# Patient Record
Sex: Female | Born: 1980
Health system: Southern US, Community
[De-identification: ages and names within clinical notes are randomized; demographics above are authoritative.]

## PROBLEM LIST (undated history)

## (undated) DIAGNOSIS — R87629 Unspecified abnormal cytological findings in specimens from vagina: Secondary | ICD-10-CM

## (undated) HISTORY — DX: Unspecified abnormal cytological findings in specimens from vagina: R87.629

---

## 2015-11-25 LAB — HM HIV SCREENING LAB: HM HIV Screening: NEGATIVE

## 2018-08-29 ENCOUNTER — Other Ambulatory Visit: Payer: Self-pay

## 2018-08-29 ENCOUNTER — Encounter: Payer: Self-pay | Admitting: Family Medicine

## 2018-08-29 ENCOUNTER — Ambulatory Visit (INDEPENDENT_AMBULATORY_CARE_PROVIDER_SITE_OTHER): Payer: Commercial Managed Care - PPO | Admitting: Family Medicine

## 2018-08-29 VITALS — BP 98/63 | HR 72 | Wt 105.8 lb

## 2018-08-29 DIAGNOSIS — Z3201 Encounter for pregnancy test, result positive: Secondary | ICD-10-CM

## 2018-08-29 DIAGNOSIS — N926 Irregular menstruation, unspecified: Secondary | ICD-10-CM

## 2018-08-29 DIAGNOSIS — Z01419 Encounter for gynecological examination (general) (routine) without abnormal findings: Secondary | ICD-10-CM

## 2018-08-29 LAB — POCT URINE PREGNANCY: Preg Test, Ur: POSITIVE — AB

## 2018-08-29 NOTE — Progress Notes (Signed)
   ESTABLISH Care/PREVENTATIVE CARE ENCOUNTER NOTE  Subjective:   Linda Hanson is a 38 y.o. [phone] female here for a routine annual gynecologic exam.  Current complaints: ammenorhea- positive pregnancy test.  Currently nursing 33 yo son. Reports having two uncomplicated pregnancies and went post dates with both with natural labors using Bradley Method.  Denies abnormal vaginal bleeding, discharge, pelvic pain, problems with intercourse or other gynecologic concerns.    Gynecologic History Patient's last menstrual period was 07/22/2018. Contraception: none Last Pap: NIL, HPV neg per patient report---thinks it was 2017 Last mammogram: NA  The following portions of the patient's history were reviewed and updated as appropriate: allergies, current medications, past family history, past medical history, past social history, past surgical history and problem list.  Review of Systems Pertinent items are noted in HPI.   Objective:  BP 98/63   Pulse 72   Wt 105 lb 12.8 oz (48 kg)   LMP 07/22/2018  CONSTITUTIONAL: Well-developed, well-nourished female in no acute distress.  HENT:  Normocephalic, atraumatic, External right and left ear normal. Oropharynx is clear and moist EYES:  No scleral icterus.  NECK: Normal range of motion, supple, no masses.  Normal thyroid.  SKIN: Skin is warm and dry. No rash noted. Not diaphoretic. No erythema. No pallor. NEUROLOGIC: Alert and oriented to person, place, and time. Normal reflexes, muscle tone coordination. No cranial nerve deficit noted. PSYCHIATRIC: Normal mood and affect. Normal behavior. Normal judgment and thought content. CARDIOVASCULAR: Normal heart rate noted, regular rhythm. 2+ distal pulses. RESPIRATORY: Effort and breath sounds normal, no problems with respiration noted. BREASTS: Symmetric in size. No masses, skin changes, nipple drainage, or lymphadenopathy. ABDOMEN: Soft,  no distention noted.  No tenderness, rebound or guarding.  PELVIC:  Normal appearing external genitalia; normal appearing vaginal mucosa and cervix.  No abnormal discharge noted.   Normal uterine size, no other palpable masses, no uterine or adnexal tenderness. MUSCULOSKELETAL: Normal range of motion.   INCORRECT RESULT ENTERED BY CMA Demetrice Phillip Heal--- Patient's Pregnancy test was positive  Results for orders placed or performed in visit on 08/29/18 (from the past 48 hour(s))  POCT urine pregnancy     Status: None   Collection Time: 08/29/18 10:44 AM  Result Value Ref Range   Preg Test, Ur Negative Negative     Assessment and Plan:  1) Annual gynecologic examination -- did not collect pap, Needs ROI for last pap.  Will follow up results of pap smear and manage accordingly. Routine preventative health maintenance measures emphasized.  2) Amenorrhea-- UPT was positive (re-run after CMA finalized result, reviewed with lab representative to confirm positive result and with patient). Will bring pt back in 4 wks for initial prenatal care.   Please refer to After Visit Summary for other counseling recommendations.   Return in about 4 weeks (around 09/26/2018) for initial prenatal.  Future Appointments  Date Time Provider [place]  10/03/2018  9:30 AM Caren Macadam, MD [facility]  10/15/2018 10:00 AM Lesleigh Noe, MD [facility]    Caren Macadam, MD, MPH, ABFM Attending Physician Center for [facility]

## 2018-08-29 NOTE — Progress Notes (Signed)
New Patient need to sign medical release

## 2018-09-04 ENCOUNTER — Encounter: Payer: Self-pay | Admitting: Radiology

## 2018-10-02 DIAGNOSIS — O0990 Supervision of high risk pregnancy, unspecified, unspecified trimester: Secondary | ICD-10-CM | POA: Insufficient documentation

## 2018-10-03 ENCOUNTER — Ambulatory Visit (INDEPENDENT_AMBULATORY_CARE_PROVIDER_SITE_OTHER): Payer: Commercial Managed Care - PPO | Admitting: Nurse Practitioner

## 2018-10-03 ENCOUNTER — Encounter: Payer: Self-pay | Admitting: Nurse Practitioner

## 2018-10-03 ENCOUNTER — Other Ambulatory Visit: Payer: Self-pay | Admitting: Nurse Practitioner

## 2018-10-03 DIAGNOSIS — Z124 Encounter for screening for malignant neoplasm of cervix: Secondary | ICD-10-CM

## 2018-10-03 DIAGNOSIS — O0991 Supervision of high risk pregnancy, unspecified, first trimester: Secondary | ICD-10-CM | POA: Diagnosis not present

## 2018-10-03 DIAGNOSIS — O0990 Supervision of high risk pregnancy, unspecified, unspecified trimester: Secondary | ICD-10-CM

## 2018-10-03 DIAGNOSIS — Z3A10 10 weeks gestation of pregnancy: Secondary | ICD-10-CM | POA: Diagnosis not present

## 2018-10-03 DIAGNOSIS — Z113 Encounter for screening for infections with a predominantly sexual mode of transmission: Secondary | ICD-10-CM

## 2018-10-03 DIAGNOSIS — Z1151 Encounter for screening for human papillomavirus (HPV): Secondary | ICD-10-CM

## 2018-10-03 DIAGNOSIS — O09529 Supervision of elderly multigravida, unspecified trimester: Secondary | ICD-10-CM

## 2018-10-03 DIAGNOSIS — N898 Other specified noninflammatory disorders of vagina: Secondary | ICD-10-CM | POA: Diagnosis not present

## 2018-10-03 DIAGNOSIS — O09521 Supervision of elderly multigravida, first trimester: Secondary | ICD-10-CM

## 2018-10-03 MED ORDER — BLOOD PRESSURE KIT: PACK | 0 refills | Status: DC

## 2018-10-03 MED ORDER — BLOOD PRESSURE MONITORING KIT: PACK | 0 refills | Status: DC

## 2018-10-03 MED ORDER — PRENATAL + DHA 27-1 & 250 MG PO THPK: 1.0000 | PACK | Freq: Every day | ORAL | 11 refills | Status: DC

## 2018-10-03 NOTE — Progress Notes (Signed)
DATING AND VIABILITY SONOGRAM   Linda Hanson is a 38 y.o. year old [phone] with LMP Patient's last menstrual period was 07/22/2018. which would correlate to  [redacted]w[redacted]d weeks gestation.  She has regular menstrual cycles.   She is here today for a confirmatory initial sonogram.    GESTATION: SINGLETON yes     FETAL ACTIVITY:          Heart rate     162          The fetus is active.  GESTATIONAL AGE AND  BIOMETRICS:  Gestational criteria: Estimated Date of Delivery: 04/28/19 by LMP now at [redacted]w[redacted]d  Previous Scans:0  GESTATIONAL SAC            mm         weeks  CROWN RUMP LENGTH         3.85 mm        10.5 weeks                                                   AVERAGE EGA(BY THIS SCAN):  10.5 weeks  WORKING EDD( LMP ):  04/28/2019     TECHNICIAN COMMENTS:  Patient informed that the ultrasound is considered a limited obstetric ultrasound and is not intended to be a complete ultrasound exam. Patient also informed that the ultrasound is not being completed with the intent of assessing for fetal or placental anomalies or any pelvic abnormalities. Explained that the purpose of today's ultrasound is to assess for fetal heart rate. Patient acknowledges the purpose of the exam and the limitations of the study.     A copy of this report including all images has been saved and backed up to a second source for retrieval if needed. All measures and details of the anatomical scan, placentation, fluid volume and pelvic anatomy are contained in that report.  Crosby Oyster 10/03/2018 9:54 AM

## 2018-10-03 NOTE — Progress Notes (Signed)
Pap smear today Flu injection? No  Blood Pressure monitor sent to [facility]

## 2018-10-03 NOTE — Addendum Note (Signed)
Addended by: Phillip Heal, Constantinos Krempasky A on: 10/03/2018 04:50 PM   Modules accepted: Orders

## 2018-10-03 NOTE — Progress Notes (Signed)
Subjective:   Linda Hanson is a 38 y.o. [id number] at [redacted]w[redacted]d by LMP being seen today for her first obstetrical visit.  Her obstetrical history is significant for advanced maternal age. Patient does intend to breast feed. Pregnancy history fully reviewed.  Patient reports no complaints.  HISTORY: OB History  Gravida Para Term Preterm AB Living  6 2 2  0 3 2  SAB TAB Ectopic Multiple Live Births  3 0 0 0 2    # Outcome Date GA Lbr Len/2nd Weight Sex Delivery Anes PTL Lv  6 Current           5 Term 07/15/16 [redacted]w[redacted]d   M Vag-Spont None N LIV  4 SAB 07/11/15          3 SAB 05/11/15          2 Term 06/28/13 [redacted]w[redacted]d   F Vag-Spont None N LIV  1 SAB 01/10/09       N    Past Medical History:  Diagnosis Date  . Vaginal Pap smear, abnormal    hpv   History reviewed. No pertinent surgical history. History reviewed. No pertinent family history. Social History   Tobacco Use  . Smoking status: Never Smoker  . Smokeless tobacco: Never Used  Substance Use Topics  . Alcohol use: Not Currently    Alcohol/week: 1.0 standard drinks    Types: 1 Cans of beer per week  . Drug use: Never   Allergies  Allergen Reactions  . Penicillin G Hives   Current Outpatient Medications on File Prior to Visit  Medication Sig Dispense Refill  . Prenatal 28-0.8 MG TABS Take by mouth.     No current facility-administered medications on file prior to visit.      Exam   Vitals:   10/03/18 0931 10/03/18 0935  BP: 108/69   Pulse: 72   Weight: 108 lb 9.6 oz (49.3 kg)   Height:  5\' 1"  (1.549 m)   Fetal Heart Rate (bpm): 162  Uterus:  Fundal Height: 10 cm  Pelvic Exam: Perineum: no hemorrhoids, normal perineum   Vulva: normal external genitalia, no lesions   Vagina:  normal mucosa, normal discharge   Cervix: no lesions and normal, pap smear done. Closed and thick    Adnexa: normal adnexa and no mass, fullness, tenderness   Bony Pelvis: average  System: General: well-developed, well-nourished female  in no acute distress   Breast:  normal appearance, no masses or tenderness   Skin: normal coloration and turgor, no rashes   Neurologic: oriented, normal, negative, normal mood   Extremities: normal strength, tone, and muscle mass, ROM of all joints is normal   HEENT extraocular movement intact and sclera clear, anicteric   Mouth/Teeth mucous membranes moist, pharynx normal without lesions and dental hygiene good   Neck supple and no masses, normal thyroid   Cardiovascular: regular rate and rhythm   Respiratory:  no respiratory distress, normal breath sounds   Abdomen: soft, non-tender; no masses,  no organomegaly     Assessment:   Pregnancy: [id number] Patient Active Problem List   Diagnosis Date Noted  . Advanced maternal age in multigravida 10/03/2018  . Supervision of high risk pregnancy, antepartum 10/02/2018     Plan:  1. Supervision of high risk pregnancy, antepartum Mild nausea but no vomiting. Advised about [facility] and entrance C Client has had 2 Leory Plowman births and wants a natural birth this time as well. Currently still breastfeeds her 37 year old some  and plans to breastfeed this baby.  - Cytology - PAP( [place]) - Culture, OB Urine - Hemoglobinopathy evaluation - Enroll patient in Babyscripts Program - Obstetric Panel, Including HIV - [place] MFM OB COMP + 14 WK; Future  2. Antepartum multigravida of advanced maternal age Declines genetic testing today.   Initial labs drawn. Continue prenatal vitamins. Genetic Screening discussed, discussed and declines: .Marland Kitchen Ultrasound discussed; fetal anatomic survey: ordered. Problem list reviewed and updated. The nature of [facility] with multiple MDs and other Advanced Practice Providers was explained to patient; also emphasized that residents, students are part of our team. Routine obstetric precautions reviewed. Return in about 4 weeks (around 10/31/2018) for virtual ROB.  Total  face-to-face time with patient: 40 minutes.  Over 50% of encounter was spent on counseling and coordination of care.     Nolene Bernheim, FNP Family Nurse Practitioner, [facility] for [employer], [facility] 10/03/2018 12:04 PM

## 2018-10-03 NOTE — Addendum Note (Signed)
Addended by: Phillip Heal, DEMETRICE A on: 10/03/2018 04:48 PM   Modules accepted: Orders

## 2018-10-03 NOTE — Patient Instructions (Signed)
First Trimester of Pregnancy  The first trimester of pregnancy is from week 1 until the end of week 13 (months 1 through 3). During this time, your baby will begin to develop inside you. At 6-8 weeks, the eyes and face are formed, and the heartbeat can be seen on ultrasound. At the end of 12 weeks, all the baby's organs are formed. Prenatal care is all the medical care you receive before the birth of your baby. Make sure you get good prenatal care and follow all of your doctor's instructions. Follow these instructions at home: Medicines  Take over-the-counter and prescription medicines only as told by your doctor. Some medicines are safe and some medicines are not safe during pregnancy.  Take a prenatal vitamin that contains at least 600 micrograms (mcg) of folic acid.  If you have trouble pooping (constipation), take medicine that will make your stool soft (stool softener) if your doctor approves. Eating and drinking   Eat regular, healthy meals.  Your doctor will tell you the amount of weight gain that is right for you.  Avoid raw meat and uncooked cheese.  If you feel sick to your stomach (nauseous) or throw up (vomit): ? Eat 4 or 5 small meals a day instead of 3 large meals. ? Try eating a few soda crackers. ? Drink liquids between meals instead of during meals.  To prevent constipation: ? Eat foods that are high in fiber, like fresh fruits and vegetables, whole grains, and beans. ? Drink enough fluids to keep your pee (urine) clear or pale yellow. Activity  Exercise only as told by your doctor. Stop exercising if you have cramps or pain in your lower belly (abdomen) or low back.  Do not exercise if it is too hot, too humid, or if you are in a place of great height (high altitude).  Try to avoid standing for long periods of time. Move your legs often if you must stand in one place for a long time.  Avoid heavy lifting.  Wear low-heeled shoes. Sit and stand up straight.   You can have sex unless your doctor tells you not to. Relieving pain and discomfort  Wear a good support bra if your breasts are sore.  Take warm water baths (sitz baths) to soothe pain or discomfort caused by hemorrhoids. Use hemorrhoid cream if your doctor says it is okay.  Rest with your legs raised if you have leg cramps or low back pain.  If you have puffy, bulging veins (varicose veins) in your legs: ? Wear support hose or compression stockings as told by your doctor. ? Raise (elevate) your feet for 15 minutes, 3-4 times a day. ? Limit salt in your food. Prenatal care  Schedule your prenatal visits by the twelfth week of pregnancy.  Write down your questions. Take them to your prenatal visits.  Keep all your prenatal visits as told by your doctor. This is important. Safety  Wear your seat belt at all times when driving.  Make a list of emergency phone numbers. The list should include numbers for family, friends, the hospital, and police and fire departments. General instructions  Ask your doctor for a referral to a local prenatal class. Begin classes no later than at the start of month 6 of your pregnancy.  Ask for help if you need counseling or if you need help with nutrition. Your doctor can give you advice or tell you where to go for help.  Do not use hot tubs, steam   rooms, or saunas.  Do not douche or use tampons or scented sanitary pads.  Do not cross your legs for long periods of time.  Avoid all herbs and alcohol. Avoid drugs that are not approved by your doctor.  Do not use any tobacco products, including cigarettes, chewing tobacco, and electronic cigarettes. If you need help quitting, ask your doctor. You may get counseling or other support to help you quit.  Avoid cat litter boxes and soil used by cats. These carry germs that can cause birth defects in the baby and can cause a loss of your baby (miscarriage) or stillbirth.  Visit your dentist. At home,  brush your teeth with a soft toothbrush. Be gentle when you floss. Contact a doctor if:  You are dizzy.  You have mild cramps or pressure in your lower belly.  You have a nagging pain in your belly area.  You continue to feel sick to your stomach, you throw up, or you have watery poop (diarrhea).  You have a bad smelling fluid coming from your vagina.  You have pain when you pee (urinate).  You have increased puffiness (swelling) in your face, hands, legs, or ankles. Get help right away if:  You have a fever.  You are leaking fluid from your vagina.  You have spotting or bleeding from your vagina.  You have very bad belly cramping or pain.  You gain or lose weight rapidly.  You throw up blood. It may look like coffee grounds.  You are around people who have [place] measles, fifth disease, or chickenpox.  You have a very bad headache.  You have shortness of breath.  You have any kind of trauma, such as from a fall or a car accident. Summary  The first trimester of pregnancy is from week 1 until the end of week 13 (months 1 through 3).  To take care of yourself and your unborn baby, you will need to eat healthy meals, take medicines only if your doctor tells you to do so, and do activities that are safe for you and your baby.  Keep all follow-up visits as told by your doctor. This is important as your doctor will have to ensure that your baby is healthy and growing well. This information is not intended to replace advice given to you by your health care provider. Make sure you discuss any questions you have with your health care provider. Document Released: 06/15/2007 Document Revised: 04/19/2018 Document Reviewed: 01/05/2016 Elsevier Patient Education  [place].  Morning Sickness  Morning sickness is when you feel sick to your stomach (nauseous) during pregnancy. You may feel sick to your stomach and throw up (vomit). You may feel sick in the morning, but you  can feel this way at any time of day. Some women feel very sick to their stomach and cannot stop throwing up (hyperemesis gravidarum). Follow these instructions at home: Medicines  Take over-the-counter and prescription medicines only as told by your doctor. Do not take any medicines until you talk with your doctor about them first.  Taking multivitamins before getting pregnant can stop or lessen the harshness of morning sickness. Eating and drinking  Eat dry toast or crackers before getting out of bed.  Eat 5 or 6 small meals a day.  Eat dry and bland foods like rice and baked potatoes.  Do not eat greasy, fatty, or spicy foods.  Have someone cook for you if the smell of food causes you to feel sick  or throw up.  If you feel sick to your stomach after taking prenatal vitamins, take them at night or with a snack.  Eat protein when you need a snack. Nuts, yogurt, and cheese are good choices.  Drink fluids throughout the day.  Try ginger ale made with real ginger, ginger tea made from fresh grated ginger, or ginger candies. General instructions  Do not use any products that have nicotine or tobacco in them, such as cigarettes and e-cigarettes. If you need help quitting, ask your doctor.  Use an air purifier to keep the air in your house free of smells.  Get lots of fresh air.  Try to avoid smells that make you feel sick.  Try: ? Wearing a bracelet that is used for seasickness (acupressure wristband). ? Going to a doctor who puts thin needles into certain body points (acupuncture) to improve how you feel. Contact a doctor if:  You need medicine to feel better.  You feel dizzy or light-headed.  You are losing weight. Get help right away if:  You feel very sick to your stomach and cannot stop throwing up.  You pass out (faint).  You have very bad pain in your belly. Summary  Morning sickness is when you feel sick to your stomach (nauseous) during pregnancy.  You  may feel sick in the morning, but you can feel this way at any time of day.  Making some changes to what you eat may help your symptoms go away. This information is not intended to replace advice given to you by your health care provider. Make sure you discuss any questions you have with your health care provider. Document Released: 02/04/2004 Document Revised: 12/09/2016 Document Reviewed: 01/28/2016 Elsevier Patient Education  2020 [employer].

## 2018-10-04 LAB — CERVICOVAGINAL ANCILLARY ONLY
Bacterial Vaginitis (gardnerella): NEGATIVE
Candida Glabrata: NEGATIVE
Candida Vaginitis: NEGATIVE
Molecular Disclaimer: NEGATIVE
Molecular Disclaimer: NEGATIVE
Molecular Disclaimer: NEGATIVE
Molecular Disclaimer: NORMAL
Trichomonas: NEGATIVE

## 2018-10-05 LAB — OBSTETRIC PANEL, INCLUDING HIV
Antibody Screen: NEGATIVE
Basophils Absolute: 0.1 10*3/uL (ref 0.0–0.2)
Basos: 1 %
EOS (ABSOLUTE): 0 10*3/uL (ref 0.0–0.4)
Eos: 1 %
HIV Screen 4th Generation wRfx: NONREACTIVE
Hematocrit: 39.3 % (ref 34.0–46.6)
Hemoglobin: 13.2 g/dL (ref 11.1–15.9)
Hepatitis B Surface Ag: NEGATIVE
Immature Grans (Abs): 0 10*3/uL (ref 0.0–0.1)
Immature Granulocytes: 0 %
Lymphocytes Absolute: 1.7 10*3/uL (ref 0.7–3.1)
Lymphs: 20 %
MCH: 31 pg (ref 26.6–33.0)
MCHC: 33.6 g/dL (ref 31.5–35.7)
MCV: 92 fL (ref 79–97)
Monocytes Absolute: 0.5 10*3/uL (ref 0.1–0.9)
Monocytes: 6 %
Neutrophils Absolute: 6.2 10*3/uL (ref 1.4–7.0)
Neutrophils: 72 %
Platelets: 236 10*3/uL (ref 150–450)
RBC: 4.26 x10E6/uL (ref 3.77–5.28)
RDW: 11.8 % (ref 11.7–15.4)
RPR Ser Ql: NONREACTIVE
Rh Factor: NEGATIVE
Rubella Antibodies, IGG: 2.21 index (ref >0.99)
WBC: 8.6 10*3/uL (ref 3.4–10.8)

## 2018-10-05 LAB — CERVICOVAGINAL ANCILLARY ONLY
Chlamydia: NEGATIVE
Neisseria Gonorrhea: NEGATIVE

## 2018-10-05 LAB — HEMOGLOBINOPATHY EVALUATION
HGB C: 0 %
HGB S: 0 %
HGB VARIANT: 0 %
Hemoglobin A2 Quantitation: 2.3 % (ref 1.8–3.2)
Hemoglobin F Quantitation: 0 % (ref 0.0–2.0)
Hgb A: 97.7 % (ref 96.4–98.8)

## 2018-10-05 LAB — CULTURE, OB URINE

## 2018-10-05 LAB — URINE CULTURE, OB REFLEX

## 2018-10-09 ENCOUNTER — Encounter: Payer: Self-pay | Admitting: Nurse Practitioner

## 2018-10-09 DIAGNOSIS — R87810 Cervical high risk human papillomavirus (HPV) DNA test positive: Secondary | ICD-10-CM | POA: Insufficient documentation

## 2018-10-09 LAB — CYTOLOGY - PAP
Diagnosis: NEGATIVE
HPV 16: NEGATIVE
HPV 18 / 45: NEGATIVE
High risk HPV: POSITIVE — AB
Molecular Disclaimer: 56
Molecular Disclaimer: NEGATIVE
Molecular Disclaimer: NORMAL

## 2018-10-15 ENCOUNTER — Ambulatory Visit: Payer: Commercial Managed Care - PPO | Admitting: Family Medicine

## 2018-10-31 ENCOUNTER — Other Ambulatory Visit: Payer: Self-pay

## 2018-10-31 ENCOUNTER — Telehealth (INDEPENDENT_AMBULATORY_CARE_PROVIDER_SITE_OTHER): Payer: Commercial Managed Care - PPO | Admitting: Family Medicine

## 2018-10-31 ENCOUNTER — Encounter: Payer: Self-pay | Admitting: Family Medicine

## 2018-10-31 DIAGNOSIS — O0992 Supervision of high risk pregnancy, unspecified, second trimester: Secondary | ICD-10-CM

## 2018-10-31 DIAGNOSIS — O09522 Supervision of elderly multigravida, second trimester: Secondary | ICD-10-CM

## 2018-10-31 DIAGNOSIS — O0990 Supervision of high risk pregnancy, unspecified, unspecified trimester: Secondary | ICD-10-CM

## 2018-10-31 DIAGNOSIS — Z3A14 14 weeks gestation of pregnancy: Secondary | ICD-10-CM

## 2018-10-31 NOTE — Progress Notes (Signed)
   PRENATAL VISIT NOTE  Subjective:  Linda Hanson is a 38 y.o. [id number] at [redacted]w[redacted]d being seen today for ongoing prenatal care.  She is currently monitored for the following issues for this low-risk pregnancy and has Supervision of high risk pregnancy, antepartum; Advanced maternal age in multigravida; and Cervical high risk HPV (human papillomavirus) test positive on their problem list.  Patient reports no complaints.  Contractions: Not present. Vag. Bleeding: None.   . Denies leaking of fluid.   The following portions of the patient's history were reviewed and updated as appropriate: allergies, current medications, past family history, past medical history, past social history, past surgical history and problem list.   Objective:  There were no vitals filed for this visit.  Fetal Status:           General:  Alert, oriented and cooperative. Patient is in no acute distress.  Skin: Skin is warm and dry. No rash noted.   Cardiovascular: Normal heart rate noted  Respiratory: Normal respiratory effort, no problems with respiration noted  Abdomen: Soft, gravid, appropriate for gestational age.  Pain/Pressure: Absent     Pelvic: Cervical exam deferred        Extremities: Normal range of motion.     Mental Status: Normal mood and affect. Normal behavior. Normal judgment and thought content.   Assessment and Plan:  Pregnancy: [id number] at [redacted]w[redacted]d  1. Multigravida of advanced maternal age in second trimester AMA- declined NIPT/blood work.   2. Supervision of high risk pregnancy, antepartum Up to date Reviewed plan on care  Reviewed HPV status and need to Pap in 1 year Discussed normality of increased nausea  Reviewed FM starting in the next couple weeks Has anatomy scan scheduled  Preterm labor symptoms and general obstetric precautions including but not limited to vaginal bleeding, contractions, leaking of fluid and fetal movement were reviewed in detail with the patient. Please refer to  After Visit Summary for other counseling recommendations.   Return in about 6 weeks (around 12/10/2018) for Routine prenatal care, in person.  Future Appointments  Date Time Provider [place]  11/08/2018 10:20 AM Lesleigh Noe, MD [facility]  12/04/2018  9:30 AM [facility] [place] 5 [facility]    Caren Macadam, MD

## 2018-11-08 ENCOUNTER — Other Ambulatory Visit: Payer: Self-pay

## 2018-11-08 ENCOUNTER — Encounter: Payer: Self-pay | Admitting: Family Medicine

## 2018-11-08 ENCOUNTER — Ambulatory Visit (INDEPENDENT_AMBULATORY_CARE_PROVIDER_SITE_OTHER): Payer: Commercial Managed Care - PPO | Admitting: Family Medicine

## 2018-11-08 VITALS — BP 110/58 | HR 91 | Temp 98.4°F | Resp 18 | Ht 61.0 in | Wt 113.2 lb

## 2018-11-08 DIAGNOSIS — Z833 Family history of diabetes mellitus: Secondary | ICD-10-CM | POA: Diagnosis not present

## 2018-11-08 DIAGNOSIS — Z0000 Encounter for general adult medical examination without abnormal findings: Secondary | ICD-10-CM | POA: Diagnosis not present

## 2018-11-08 DIAGNOSIS — Z823 Family history of stroke: Secondary | ICD-10-CM | POA: Diagnosis not present

## 2018-11-08 NOTE — Progress Notes (Signed)
Annual Exam   Chief Complaint:  Chief Complaint  Patient presents with  . Establish Care    previous PCP with [facility], also sees gyn at [place] for [facility] gyn in [place]    History of Present Illness:  Ms. Linda Hanson is a 38 y.o. [id number] who LMP was Patient's last menstrual period was 07/22/2018., presents today for her annual examination.    Pregnancy Nausea - eating a lot to help with the nausea    Nutrition/Lifestyle Diet: pregnancy, eating to prevent nausea, tends to be lower carb but more now Exercise: exercises regularly, doing insanity videos She does get adequate calcium and Vitamin D in her diet.  Social History   Tobacco Use  Smoking Status Never Smoker  Smokeless Tobacco Never Used   Social History   Substance and Sexual Activity  Alcohol Use Not Currently  . Alcohol/week: 1.0 standard drinks  . Types: 1 Cans of beer per week   Social History   Substance and Sexual Activity  Drug Use Never     Safety The patient wears seatbelts: yes.     The patient feels safe at home and in their relationships: yes.  General Health Dentist in the last year: Yes Eye doctor: yes   Cervical Cancer Screening (Age 55-65) Last Pap: hx of HPV positive, normal cells  Family History of Breast Cancer: no Family History of Ovarian Cancer: no   Sexual Health/Menses Currently pregnant One partner   Weight Wt Readings from Last 3 Encounters:  11/08/18 113 lb 4 oz (51.4 kg)  10/03/18 108 lb 9.6 oz (49.3 kg)  08/29/18 105 lb 12.8 oz (48 kg)   Patient has normal BMI  BMI Readings from Last 1 Encounters:  11/08/18 21.40 kg/m     Chronic disease screening Blood pressure monitoring:  BP Readings from Last 3 Encounters:  11/08/18 (!) 110/58  10/03/18 108/69  08/29/18 98/63     Lipid Monitoring: Indication for screening: age >31, obesity, diabetes, family hx, CV risk factors.  Lipid screening: Not Indicated  No results found for: CHOL, HDL, LDLCALC,  LDLDIRECT, TRIG, CHOLHDL   Diabetes Screening: age >39, overweight, family hx, PCOS, hx of gestational diabetes, at risk ethnicity, elevated blood pressure >135/80.  Diabetes Screening screening: Not Indicated  No results found for: HGBA1C    Past Medical History:  Diagnosis Date  . Vaginal Pap smear, abnormal    hpv    History reviewed. No pertinent surgical history.  Prior to Admission medications   Medication Sig Start Date End Date Taking? Authorizing Provider  Blood Pressure KIT Please check blood pressure once per day. 10/03/18  Yes Burleson, Terri L, NP  Blood Pressure Monitoring KIT Please check blood pressure once per day. 10/03/18  Yes Burleson, Terri L, NP  Prenatal 28-0.8 MG TABS Take by mouth.   Yes [provider]  Prenatal-FeFum-FA-DHA w/o A (PRENATAL + DHA) 27-1 & 250 MG THPK Take 1 tablet by mouth daily. 10/03/18  Yes Burleson, Rona Ravens, NP    Allergies  Allergen Reactions  . Penicillin G Hives    Gynecologic History: Patient's last menstrual period was 07/22/2018.  Obstetric History: [id number]  Social History   Socioeconomic History  . Marital status: Married    Spouse name: [name]  . Number of children: 2  . Years of education: Master's   . Highest education level: Not on file  Occupational History  . Occupation: Actuary  Social Needs  . Financial resource strain: Not hard at all  .  Food insecurity    Worry: Not on file    Inability: Not on file  . Transportation needs    Medical: Not on file    Non-medical: Not on file  Tobacco Use  . Smoking status: Never Smoker  . Smokeless tobacco: Never Used  Substance and Sexual Activity  . Alcohol use: Not Currently    Alcohol/week: 1.0 standard drinks    Types: 1 Cans of beer per week  . Drug use: Never  . Sexual activity: Yes    Comment: pregnant  Lifestyle  . Physical activity    Days per week: Not on file    Minutes per session: Not on file  . Stress: Not on file   Relationships  . Social Herbalist on phone: Not on file    Gets together: Not on file    Attends religious service: Not on file    Active member of club or organization: Not on file    Attends meetings of clubs or organizations: Not on file    Relationship status: Not on file  . Intimate partner violence    Fear of current or ex partner: Not on file    Emotionally abused: Not on file    Physically abused: Not on file    Forced sexual activity: Not on file  Other Topics Concern  . 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 Diabetes Mother   . Arthritis Father   . Hyperlipidemia Father   . Hypertension Father   . Stroke Father 13  . Learning disabilities Sister        reading  . Heart disease Maternal Grandmother   . Hyperlipidemia Maternal Grandmother   . Heart attack Maternal Grandmother 80  . Alzheimer's disease Maternal Grandfather   . Alzheimer's disease Paternal Grandmother   . Arthritis Paternal Grandfather   . Prostate cancer Paternal Grandfather     Review of Systems  Constitutional: Negative.   HENT: Negative.   Eyes: Negative.   Respiratory: Negative.   Cardiovascular: Negative.   Gastrointestinal: Positive for nausea.  Genitourinary: Negative.   Musculoskeletal: Positive for joint pain.  Skin: Negative.   Neurological: Negative.   Endo/Heme/Allergies: Negative.   Psychiatric/Behavioral: Negative.      Physical Exam BP (!) 110/58   Pulse 91   Temp 98.4 F (36.9 C)   Resp 18   Ht '5\' 1"'   (1.549 m)   Wt 113 lb 4 oz (51.4 kg)   LMP 07/22/2018   SpO2 100%   BMI 21.40 kg/m    BP Readings from Last 3 Encounters:  11/08/18 (!) 110/58  10/03/18 108/69  08/29/18 98/63    Wt Readings from Last 3 Encounters:  11/08/18 113 lb 4 oz (51.4 kg)  10/03/18 108 lb 9.6 oz (49.3 kg)  08/29/18 105 lb 12.8 oz (48 kg)  Physical Exam Constitutional:      General: She is not in acute distress.    Appearance: She is well-developed. She is not diaphoretic.  HENT:     Head: Normocephalic and atraumatic.     Right Ear: External ear normal.     Left Ear: External ear normal.     Nose: Nose normal.  Eyes:     General: No scleral icterus.    Conjunctiva/sclera: Conjunctivae normal.  Neck:     Musculoskeletal: Neck supple.  Cardiovascular:     Rate and Rhythm: Normal rate and regular rhythm.     Heart sounds: No murmur.  Pulmonary:     Effort: Pulmonary effort is normal. No respiratory distress.     Breath sounds: Normal breath sounds. No wheezing.  Abdominal:     General: Bowel sounds are normal. There is no distension.     Palpations: Abdomen is soft. There is no mass.     Tenderness: There is no abdominal tenderness. There is no guarding or rebound.     Comments: gravid  Musculoskeletal: Normal range of motion.     Comments: Left knee. No bone tenderness. Normal ROM.   Lymphadenopathy:     Cervical: No cervical adenopathy.  Skin:    General: Skin is warm and dry.     Capillary Refill: Capillary refill takes less than 2 seconds.  Neurological:     Mental Status: She is alert and oriented to person, place, and time.     Deep Tendon Reflexes: Reflexes normal.  Psychiatric:        Mood and Affect: Mood normal.        Behavior: Behavior normal.       Results: Depression screen PHQ 2/9 11/08/2018  Decreased Interest 0  Down, Depressed, Hopeless 0  PHQ - 2 Score 0      Assessment: 38 y.o. [id number] female here for routine annual examination.  Plan: Problem List  Items Addressed This Visit    None    Visit Diagnoses    Annual physical exam    -  Primary   Relevant Orders   Lipid panel   Glucose, random   Family history of diabetes mellitus       Relevant Orders   Glucose, random   Family history of stroke       Relevant Orders   Lipid panel       Screening: -- Blood pressure screen normal -- cholesterol screening: will obtain -- Weight screening: normal -- Diabetes Screening: will obtain -- Nutrition: normal    Psych -- Depression screening (PHQ-9): low risk   Safety -- tobacco screening: not using -- alcohol screening:  low-risk usage. -- no evidence of domestic violence or intimate partner violence.   Cancer Screening -- pap smear up to date per ASCCP guidelines -- family history of breast cancer screening: done. not at high risk.   Immunizations -- flu vaccine up to date -- TDAP q10 years up to date  Currently Pregnant - getting routine care   Linda Hanson

## 2018-11-08 NOTE — Patient Instructions (Addendum)
#  Pregnancy - seabands - B6 supplement - Ginger - talk with OB about Unisom or other suggestions  Come back as needed otherwise I'd recommend annual or every 2 years exams.

## 2018-11-09 ENCOUNTER — Encounter: Payer: Self-pay | Admitting: Family Medicine

## 2018-11-09 ENCOUNTER — Other Ambulatory Visit (INDEPENDENT_AMBULATORY_CARE_PROVIDER_SITE_OTHER): Payer: Commercial Managed Care - PPO

## 2018-11-09 DIAGNOSIS — Z833 Family history of diabetes mellitus: Secondary | ICD-10-CM

## 2018-11-09 DIAGNOSIS — Z823 Family history of stroke: Secondary | ICD-10-CM | POA: Diagnosis not present

## 2018-11-09 DIAGNOSIS — Z0000 Encounter for general adult medical examination without abnormal findings: Secondary | ICD-10-CM | POA: Diagnosis not present

## 2018-11-09 LAB — LIPID PANEL
Cholesterol: 166 mg/dL (ref 0–200)
HDL: 79.5 mg/dL (ref >39.00)
LDL Cholesterol: 74 mg/dL (ref 0–99)
NonHDL: 86.64
Total CHOL/HDL Ratio: 2
Triglycerides: 64 mg/dL (ref 0.0–149.0)
VLDL: 12.8 mg/dL (ref 0.0–40.0)

## 2018-11-09 LAB — GLUCOSE, RANDOM: Glucose, Bld: 89 mg/dL (ref 70–99)

## 2018-11-12 ENCOUNTER — Ambulatory Visit (INDEPENDENT_AMBULATORY_CARE_PROVIDER_SITE_OTHER): Payer: Commercial Managed Care - PPO | Admitting: *Deleted

## 2018-11-12 ENCOUNTER — Other Ambulatory Visit: Payer: Self-pay

## 2018-11-12 VITALS — BP 114/72 | HR 73

## 2018-11-12 DIAGNOSIS — O0990 Supervision of high risk pregnancy, unspecified, unspecified trimester: Secondary | ICD-10-CM

## 2018-11-12 NOTE — Progress Notes (Signed)
Pt called office this AM stating she would like a FHR check. Pt states she is just really nervous and had some very light spotting, denies any cramping.   FHR in office 147 via doppler.   Pt to follow up at next [place] visit.   Crosby Oyster, RN

## 2018-11-12 NOTE — Progress Notes (Signed)
Patient seen and assessed by nursing staff during this encounter. I have reviewed the chart and agree with the documentation and plan.  Verita Schneiders, MD 11/12/2018 9:08 AM

## 2018-11-26 ENCOUNTER — Telehealth: Payer: Self-pay | Admitting: Family Medicine

## 2018-11-26 NOTE — Telephone Encounter (Signed)
Rec'd from [employer] forwarded 79 pages to Dr. Waunita Schooner

## 2018-12-04 ENCOUNTER — Other Ambulatory Visit: Payer: Self-pay

## 2018-12-04 ENCOUNTER — Other Ambulatory Visit: Payer: Self-pay | Admitting: Nurse Practitioner

## 2018-12-04 ENCOUNTER — Ambulatory Visit (HOSPITAL_COMMUNITY)
Admission: RE | Admit: 2018-12-04 | Discharge: 2018-12-04 | Disposition: A | Discharge status: Home / Self Care | Payer: Commercial Managed Care - PPO | Source: Ambulatory Visit | Attending: Obstetrics and Gynecology | Admitting: Obstetrics and Gynecology

## 2018-12-04 DIAGNOSIS — Z3A19 19 weeks gestation of pregnancy: Secondary | ICD-10-CM | POA: Diagnosis not present

## 2018-12-04 DIAGNOSIS — O09522 Supervision of elderly multigravida, second trimester: Secondary | ICD-10-CM | POA: Diagnosis not present

## 2018-12-04 DIAGNOSIS — O0990 Supervision of high risk pregnancy, unspecified, unspecified trimester: Secondary | ICD-10-CM

## 2018-12-05 ENCOUNTER — Ambulatory Visit (HOSPITAL_COMMUNITY): Payer: Commercial Managed Care - PPO

## 2018-12-13 ENCOUNTER — Other Ambulatory Visit: Payer: Self-pay

## 2018-12-13 ENCOUNTER — Ambulatory Visit (INDEPENDENT_AMBULATORY_CARE_PROVIDER_SITE_OTHER): Payer: Commercial Managed Care - PPO | Admitting: Obstetrics and Gynecology

## 2018-12-13 VITALS — BP 90/51 | HR 72 | Wt 117.6 lb

## 2018-12-13 DIAGNOSIS — O0990 Supervision of high risk pregnancy, unspecified, unspecified trimester: Secondary | ICD-10-CM

## 2018-12-13 DIAGNOSIS — R87810 Cervical high risk human papillomavirus (HPV) DNA test positive: Secondary | ICD-10-CM

## 2018-12-13 DIAGNOSIS — O0992 Supervision of high risk pregnancy, unspecified, second trimester: Secondary | ICD-10-CM

## 2018-12-13 DIAGNOSIS — Z3A20 20 weeks gestation of pregnancy: Secondary | ICD-10-CM

## 2018-12-13 DIAGNOSIS — O09522 Supervision of elderly multigravida, second trimester: Secondary | ICD-10-CM

## 2018-12-13 NOTE — Progress Notes (Signed)
Prenatal Visit Note Date: 12/13/2018 Clinic: Center for [facility]  Subjective:  Linda Hanson is a 38 y.o. [id number] at [redacted]w[redacted]d being seen today for ongoing prenatal care.  She is currently monitored for the following issues for this low-risk pregnancy and has Supervision of high risk pregnancy, antepartum; Advanced maternal age in multigravida; and Cervical high risk HPV (human papillomavirus) test positive on their problem list.  Patient reports no complaints.   Contractions: Not present. Vag. Bleeding: None.  Movement: Present. Denies leaking of fluid.   The following portions of the patient's history were reviewed and updated as appropriate: allergies, current medications, past family history, past medical history, past social history, past surgical history and problem list. Problem list updated.  Objective:   Vitals:   12/13/18 0828  BP: (!) 90/51  Pulse: 72  Weight: 117 lb 9.6 oz (53.3 kg)    Fetal Status: Fetal Heart Rate (bpm): 152   Movement: Present     General:  Alert, oriented and cooperative. Patient is in no acute distress.  Skin: Skin is warm and dry. No rash noted.   Cardiovascular: Normal heart rate noted  Respiratory: Normal respiratory effort, no problems with respiration noted  Abdomen: Soft, gravid, appropriate for gestational age. Pain/Pressure: Absent     Pelvic:  Cervical exam deferred        Extremities: Normal range of motion.     Mental Status: Normal mood and affect. Normal behavior. Normal judgment and thought content.   Urinalysis:      Assessment and Plan:  Pregnancy: [id number] at [redacted]w[redacted]d  1. Supervision of high risk pregnancy, antepartum Routine care. Declines genetics. Anatomy u/s neg. Repeat PRN. BP cuff given today. Continue with baby scripts. 28wk labs nv  2. Multigravida of advanced maternal age in second trimester See above. No issues  3. Cervical high risk HPV (human papillomavirus) test positive Rpt pap Sept  2021  Preterm labor symptoms and general obstetric precautions including but not limited to vaginal bleeding, contractions, leaking of fluid and fetal movement were reviewed in detail with the patient. Please refer to After Visit Summary for other counseling recommendations.  Return in about 2 months (around 02/13/2019) for in person, 2hr GTT.   Aletha Halim, MD

## 2019-01-11 NOTE — L&D Delivery Note (Signed)
OB/GYN Faculty Practice Delivery Note  Linda Hanson is a 39 y.o. [id number] s/p NSVD at [redacted]w[redacted]d. She was admitted for PROM/early labor.   ROM: 17h 103m with meconium-stained fluid GBS Status: negative Maximum Maternal Temperature: 98.5*F  Labor Progress: . She was admitted after SROM 0500 on 4/18 and progressed without augmentation to complete and delivered shortly after.  Delivery Date/Time: 04/28/19, 2245 Delivery: Called to room and patient was complete and pushing. Head delivered ROA. No nuchal cord present. Shoulder and body delivered in usual fashion. Infant with spontaneous cry, placed on mother's abdomen, dried and stimulated. Cord clamped x 2 after 1-minute delay, and cut by FOB under my direct supervision. Cord blood drawn. Placenta delivered spontaneously with gentle cord traction. Fundus firm with massage and Pitocin. Labia, perineum, vagina, and cervix were inspected, periurethral tear and perineal skin abrasion noted- repaired with 4-0 Monocryl in the usual fashion.   Due to brisk lochia after delivery of placenta, approaching 500 cc EBL, 1000 mcg cytotec placed PR with patient's consent.  Placenta: 3 vessel cord, intact, some mild calcifications along the edges to L&D Complications: None Lacerations: periurethral tear and perineal skin abrasion noted- repaired with 4-0 Monocryl in the usual fashion EBL: 633 mL Analgesia: 1% lidocaine for repairs  Postpartum Planning [x]  message to sent to schedule follow-up  [x]  vaccines UTD  Infant: female  APGARs 9, 9  3365 g  , DO OB/GYN Fellow, Faculty Practice

## 2019-01-30 ENCOUNTER — Telehealth: Payer: Self-pay

## 2019-01-30 NOTE — Telephone Encounter (Signed)
It was brought to my attention this pm that patient had made a my chart appointment for calf tenderness ?clot/thrombosis.  Given symptoms determined patient should be triaged in the event that she should need emergent care today versus waiting to be seen in office in am.   Assessment:  Patient presents with calf varicose vein for past few days that is not swollen but is "hardened" flat and sore to the touch.  Described as if sore when touching a bruise.  Area appears more bruised but has improved over past 24hours with some softening.   No redness No Pain No increase in leg edema outside of her normal LE edema related to pregnancy and she is wearing compression socks.  Denies SOB Denis Chest discomfort Denies Cough  Patient is [redacted] weeks pregnant with hx of varicose veins and exercises "heavy" daily.  She is wondering if injury related to work out but wanted to be sure not a clot.   Reviewed with PCP, Dr. Selena Batten over the phone.  Dr. Selena Batten states it is encouraging that area seems to be improving and she is willing to see patient tomorrow as long as symptoms are not progressing and ER precautions given.  In addition, Dr. Selena Batten does recommend patient reach out to on call OB to make them aware with her pregnancy hx placing her at higher risk for blood clot development.  If ER is determined this pm, OB may be able to expedite imaging through the ER rather than exposing patient to general waiting area ER further placing patient/fetus at risk during COVID pandemic.   Patient given all information and states that she will contact OB and unless they feel necessary needs to go to ER or her symptoms worsen, she will plan on seeing [place] in the am at 9.  She will call and let us know if appointment needs to be cancelled.

## 2019-01-31 ENCOUNTER — Ambulatory Visit
Admission: RE | Admit: 2019-01-31 | Discharge: 2019-01-31 | Disposition: A | Discharge status: Home / Self Care | Payer: Commercial Managed Care - PPO | Source: Ambulatory Visit | Attending: Family Medicine | Admitting: Family Medicine

## 2019-01-31 ENCOUNTER — Other Ambulatory Visit: Payer: Self-pay

## 2019-01-31 ENCOUNTER — Telehealth: Payer: Self-pay | Admitting: Family Medicine

## 2019-01-31 ENCOUNTER — Ambulatory Visit (INDEPENDENT_AMBULATORY_CARE_PROVIDER_SITE_OTHER): Payer: Commercial Managed Care - PPO | Admitting: Family Medicine

## 2019-01-31 VITALS — BP 88/52 | HR 63 | Temp 97.6°F | Resp 16 | Ht 61.0 in | Wt 128.5 lb

## 2019-01-31 DIAGNOSIS — O0990 Supervision of high risk pregnancy, unspecified, unspecified trimester: Secondary | ICD-10-CM | POA: Diagnosis present

## 2019-01-31 DIAGNOSIS — I82812 Embolism and thrombosis of superficial veins of left lower extremities: Secondary | ICD-10-CM

## 2019-01-31 NOTE — Telephone Encounter (Signed)
Reviewed results with patient.   Superficial thrombophlebitis. No DVT  ER precautions discussed  Voicemail left for oncall OB provider to discuss results.

## 2019-01-31 NOTE — Telephone Encounter (Signed)
Reached after hours provider  Dr. Macon Large with [facility]  She said no need for treatment. Elevated leg and tylenol as needed.   Called to relay good news to patient.

## 2019-01-31 NOTE — Telephone Encounter (Signed)
See note from today

## 2019-01-31 NOTE — Patient Instructions (Addendum)
Get the Ultrasound today  Can use Tylenol as needed for pain  I will reach out to your OB provider  Go to the ER if you develop breathing difficulty or tachycardia

## 2019-01-31 NOTE — Progress Notes (Signed)
   Subjective:     Linda Hanson is a 39 y.o. female presenting for Leg Problem (left calf feels bruised, varicose vein feels hard.)     HPI   #Leg problem - currently pregnant - varicose veins have been coming up on the left leg - noticed that there was firmness of the legs - does have some edema with pregnancy - noticed a bulging and firmness of the varicose   Review of Systems   Social History   Tobacco Use  Smoking Status Never Smoker  Smokeless Tobacco Never Used        Objective:    BP Readings from Last 3 Encounters:  01/31/19 (!) 88/52  12/13/18 (!) 90/51  11/12/18 114/72   Wt Readings from Last 3 Encounters:  01/31/19 128 lb 8 oz (58.3 kg)  12/13/18 117 lb 9.6 oz (53.3 kg)  11/08/18 113 lb 4 oz (51.4 kg)    BP (!) 88/52   Pulse 63   Temp 97.6 F (36.4 C)   Resp 16   Ht 5\' 1"  (1.549 m)   Wt 128 lb 8 oz (58.3 kg)   LMP 07/22/2018   SpO2 100%   BMI 24.28 kg/m    Physical Exam Constitutional:      General: She is not in acute distress.    Appearance: She is well-developed. She is not diaphoretic.  HENT:     Right Ear: External ear normal.     Left Ear: External ear normal.  Eyes:     Conjunctiva/sclera: Conjunctivae normal.  Cardiovascular:     Rate and Rhythm: Normal rate and regular rhythm.     Pulses: Normal pulses.     Heart sounds: No murmur.  Pulmonary:     Effort: Pulmonary effort is normal. No respiratory distress.     Breath sounds: Normal breath sounds. No wheezing.  Abdominal:     Comments: gravid  Musculoskeletal:     Cervical back: Neck supple.     Right lower leg: No edema.     Left lower leg: No edema.     Comments: Left LE with several varicose veins. One vein along the posterior calf with firmness and mild TTP. No swelling b/l.   Skin:    General: Skin is warm and dry.     Capillary Refill: Capillary refill takes less than 2 seconds.  Neurological:     Mental Status: She is alert. Mental status is at baseline.    Psychiatric:        Mood and Affect: Mood normal.        Behavior: Behavior normal.           Assessment & Plan:   Problem List Items Addressed This Visit      Other   Supervision of high risk pregnancy, antepartum   Relevant Orders   09/22/2018 Venous Img Lower Unilateral Left    Other Visit Diagnoses    Superficial thrombosis of left lower extremity    -  Primary   Relevant Orders   US Venous Img Lower Unilateral Left     Due to pregnancy and suspicion for superficial thrombosis will get [place] to evaluate and r/o DVT.   Will send message to [facility] provider to let them know and to help guide treatment.   Return if symptoms worsen or fail to improve.  [facility], MD

## 2019-02-01 ENCOUNTER — Encounter: Payer: Self-pay | Admitting: Obstetrics and Gynecology

## 2019-02-01 ENCOUNTER — Telehealth: Payer: Self-pay | Admitting: Obstetrics and Gynecology

## 2019-02-01 DIAGNOSIS — O2220 Superficial thrombophlebitis in pregnancy, unspecified trimester: Secondary | ICD-10-CM | POA: Insufficient documentation

## 2019-02-01 NOTE — Telephone Encounter (Signed)
OB Telephone Note  Patient called at [phone] and VM identified it as her's by name. I called her and said that as long as she or anyone else in her family does not have a h/o blood clots or any medical history that puts them at high risk for blood clots then there's nothing to do except compression stockings, elevate the leg and can do OTC topical analgesics and to keep an eye on it. If it worsens, then let us know.   Cornelia Copa MD Attending Center for [employer] (Faculty Practice) 02/01/2019 Time: 0900

## 2019-02-07 ENCOUNTER — Other Ambulatory Visit: Payer: Self-pay

## 2019-02-07 ENCOUNTER — Encounter: Payer: Self-pay | Admitting: Family Medicine

## 2019-02-07 ENCOUNTER — Ambulatory Visit (INDEPENDENT_AMBULATORY_CARE_PROVIDER_SITE_OTHER): Payer: Commercial Managed Care - PPO | Admitting: Family Medicine

## 2019-02-07 VITALS — BP 112/72 | HR 71 | Wt 128.0 lb

## 2019-02-07 DIAGNOSIS — O0990 Supervision of high risk pregnancy, unspecified, unspecified trimester: Secondary | ICD-10-CM

## 2019-02-07 DIAGNOSIS — Z23 Encounter for immunization: Secondary | ICD-10-CM

## 2019-02-07 DIAGNOSIS — Z3402 Encounter for supervision of normal first pregnancy, second trimester: Secondary | ICD-10-CM

## 2019-02-07 DIAGNOSIS — Z3A28 28 weeks gestation of pregnancy: Secondary | ICD-10-CM | POA: Diagnosis not present

## 2019-02-07 DIAGNOSIS — O0993 Supervision of high risk pregnancy, unspecified, third trimester: Secondary | ICD-10-CM

## 2019-02-07 DIAGNOSIS — O360930 Maternal care for other rhesus isoimmunization, third trimester, not applicable or unspecified: Secondary | ICD-10-CM

## 2019-02-07 DIAGNOSIS — O2223 Superficial thrombophlebitis in pregnancy, third trimester: Secondary | ICD-10-CM

## 2019-02-07 DIAGNOSIS — Z6791 Unspecified blood type, Rh negative: Secondary | ICD-10-CM

## 2019-02-07 DIAGNOSIS — O26899 Other specified pregnancy related conditions, unspecified trimester: Secondary | ICD-10-CM | POA: Insufficient documentation

## 2019-02-07 MED ORDER — RHO D IMMUNE GLOBULIN 1500 UNIT/2ML IJ SOSY
300.0000 ug | PREFILLED_SYRINGE | Freq: Once | INTRAMUSCULAR | Status: AC
  Administered 2019-02-07: 300 ug via INTRAMUSCULAR

## 2019-02-07 MED ORDER — MEDROXYPROGESTERONE ACETATE 150 MG/ML IM SUSP: 150.0000 mg | Freq: Once | INTRAMUSCULAR | Status: DC

## 2019-02-07 NOTE — Patient Instructions (Signed)

## 2019-02-07 NOTE — Progress Notes (Signed)
   PRENATAL VISIT NOTE  Subjective:  Linda Hanson is a 39 y.o. [id number] at [redacted]w[redacted]d being seen today for ongoing prenatal care.  She is currently monitored for the following issues for this low-risk pregnancy and has Supervision of high risk pregnancy, antepartum; Advanced maternal age in multigravida; Cervical high risk HPV (human papillomavirus) test positive; Superficial thrombophlebitis during pregnancy; and Rh negative state in antepartum period on their problem list.  Patient reports no complaints.  Contractions: Irregular. Vag. Bleeding: None.  Movement: Present. Denies leaking of fluid.   The following portions of the patient's history were reviewed and updated as appropriate: allergies, current medications, past family history, past medical history, past social history, past surgical history and problem list.   Objective:   Vitals:   02/07/19 0822  BP: 112/72  Pulse: 71  Weight: 128 lb (58.1 kg)    Fetal Status: Fetal Heart Rate (bpm): 154 Fundal Height: 37 cm Movement: Present     General:  Alert, oriented and cooperative. Patient is in no acute distress.  Skin: Skin is warm and dry. No rash noted.   Cardiovascular: Normal heart rate noted  Respiratory: Normal respiratory effort, no problems with respiration noted  Abdomen: Soft, gravid, appropriate for gestational age.  Pain/Pressure: Absent     Pelvic: Cervical exam deferred        Extremities: Normal range of motion.  Edema: None  Mental Status: Normal mood and affect. Normal behavior. Normal judgment and thought content.   Assessment and Plan:  Pregnancy: [id number] at [redacted]w[redacted]d 1. Encounter for supervision of normal first pregnancy in second trimester 28 wk labs - Glucose Tolerance, 2 Hours w/1 Hour - RPR - HIV Antibody (routine testing w rflx) - Tdap vaccine greater than or equal to 7yo IM - CBC  2. Supervision of high risk pregnancy, antepartum Continue routine prenatal care.  3. Rh negative state in antepartum  period Husband is Rh neg too-- - Antibody screen - rho (d) immune globulin (RHIG/RHOPHYLAC) injection 300 mcg  4. Superficial thrombophlebitis during pregnancy in third trimester Continue conservative management, compression socks, elevation, ice  Preterm labor symptoms and general obstetric precautions including but not limited to vaginal bleeding, contractions, leaking of fluid and fetal movement were reviewed in detail with the patient. Please refer to After Visit Summary for other counseling recommendations.   Return in about 4 weeks (around 03/07/2019) for virtual.  Future Appointments  Date Time Provider Department Center  03/07/2019  8:00 AM Reva Bores, MD [facility]    Reva Bores, MD

## 2019-02-08 LAB — CBC
Hematocrit: 36.3 % (ref 34.0–46.6)
Hemoglobin: 12.3 g/dL (ref 11.1–15.9)
MCH: 31.2 pg (ref 26.6–33.0)
MCHC: 33.9 g/dL (ref 31.5–35.7)
MCV: 92 fL (ref 79–97)
Platelets: 221 10*3/uL (ref 150–450)
RBC: 3.94 x10E6/uL (ref 3.77–5.28)
RDW: 12.6 % (ref 11.7–15.4)
WBC: 10.3 10*3/uL (ref 3.4–10.8)

## 2019-02-08 LAB — HIV ANTIBODY (ROUTINE TESTING W REFLEX): HIV Screen 4th Generation wRfx: NONREACTIVE

## 2019-02-08 LAB — GLUCOSE TOLERANCE, 2 HOURS W/ 1HR
Glucose, 1 hour: 143 mg/dL (ref 65–179)
Glucose, 2 hour: 70 mg/dL (ref 65–152)
Glucose, Fasting: 80 mg/dL (ref 65–91)

## 2019-02-08 LAB — RPR: RPR Ser Ql: NONREACTIVE

## 2019-02-08 LAB — ANTIBODY SCREEN: Antibody Screen: NEGATIVE

## 2019-03-07 ENCOUNTER — Other Ambulatory Visit: Payer: Self-pay

## 2019-03-07 ENCOUNTER — Telehealth (INDEPENDENT_AMBULATORY_CARE_PROVIDER_SITE_OTHER): Payer: Commercial Managed Care - PPO | Admitting: Family Medicine

## 2019-03-07 ENCOUNTER — Encounter: Payer: Self-pay | Admitting: Family Medicine

## 2019-03-07 VITALS — BP 101/65 | HR 71

## 2019-03-07 DIAGNOSIS — O0990 Supervision of high risk pregnancy, unspecified, unspecified trimester: Secondary | ICD-10-CM

## 2019-03-07 DIAGNOSIS — O26893 Other specified pregnancy related conditions, third trimester: Secondary | ICD-10-CM

## 2019-03-07 DIAGNOSIS — O2223 Superficial thrombophlebitis in pregnancy, third trimester: Secondary | ICD-10-CM

## 2019-03-07 DIAGNOSIS — O26899 Other specified pregnancy related conditions, unspecified trimester: Secondary | ICD-10-CM

## 2019-03-07 DIAGNOSIS — Z6791 Unspecified blood type, Rh negative: Secondary | ICD-10-CM

## 2019-03-07 DIAGNOSIS — Z3A32 32 weeks gestation of pregnancy: Secondary | ICD-10-CM

## 2019-03-07 NOTE — Progress Notes (Signed)
I connected with  Linda Hanson on 03/07/19 at  8:00 AM EST by telephone and verified that I am speaking with the correct person using two identifiers.   I discussed the limitations, risks, security and privacy concerns of performing an evaluation and management service by telephone and the availability of in person appointments. I also discussed with the patient that there may be a patient responsible charge related to this service. The patient expressed understanding and agreed to proceed.  Scheryl Marten, RN 03/07/2019  8:10 AM

## 2019-03-07 NOTE — Patient Instructions (Signed)

## 2019-03-07 NOTE — Progress Notes (Signed)
    TELEHEALTH OBSTETRICS PRENATAL VIRTUAL VIDEO VISIT ENCOUNTER NOTE  Provider location: Center for [facility]   I connected with Linda Hanson on 03/07/19 at  8:00 AM EST by MyChart Video Encounter at home and verified that I am speaking with the correct person using two identifiers.   I discussed the limitations, risks, security and privacy concerns of performing an evaluation and management service virtually and the availability of in person appointments. I also discussed with the patient that there may be a patient responsible charge related to this service. The patient expressed understanding and agreed to proceed. Subjective:  Linda Hanson is a 39 y.o. [id number] at [redacted]w[redacted]d being seen today for ongoing prenatal care.  She is currently monitored for the following issues for this high-risk pregnancy and has Supervision of high risk pregnancy, antepartum; Advanced maternal age in multigravida; Cervical high risk HPV (human papillomavirus) test positive; Superficial thrombophlebitis during pregnancy; and Rh negative state in antepartum period on their problem list.  Patient reports no complaints.  Contractions: Irregular. Vag. Bleeding: None.  Movement: Present. Denies any leaking of fluid.   The following portions of the patient's history were reviewed and updated as appropriate: allergies, current medications, past family history, past medical history, past social history, past surgical history and problem list.   Objective:   Vitals:   03/07/19 0809  BP: 101/65  Pulse: 71    Fetal Status:     Movement: Present     General:  Alert, oriented and cooperative. Patient is in no acute distress.  Respiratory: Normal respiratory effort, no problems with respiration noted  Mental Status: Normal mood and affect. Normal behavior. Normal judgment and thought content.  Rest of physical exam deferred due to type of encounter  Imaging: No results found.  Assessment and  Plan:  Pregnancy: [id number] at [redacted]w[redacted]d 1. Rh negative state in antepartum period S/p Rhogam  2. Superficial thrombophlebitis during pregnancy in third trimester Improving per her report  3. Supervision of high risk pregnancy, antepartum Continue prenatal care.   Preterm labor symptoms and general obstetric precautions including but not limited to vaginal bleeding, contractions, leaking of fluid and fetal movement were reviewed in detail with the patient. I discussed the assessment and treatment plan with the patient. The patient was provided an opportunity to ask questions and all were answered. The patient agreed with the plan and demonstrated an understanding of the instructions. The patient was advised to call back or seek an in-person office evaluation/go to MAU at [facility] for any urgent or concerning symptoms. Please refer to After Visit Summary for other counseling recommendations.   I provided 11 minutes of face-to-face time during this encounter.  Return in 2 weeks (on 03/21/2019) for virtual.  Future Appointments  Date Time Provider Department Center  03/20/2019 10:00 AM Federico Flake, MD [facility]  04/04/2019  9:45 AM Reva Bores, MD [facility]    Reva Bores, MD Center for [facility], [facility]

## 2019-03-20 ENCOUNTER — Other Ambulatory Visit: Payer: Self-pay

## 2019-03-20 ENCOUNTER — Encounter: Payer: Self-pay | Admitting: Family Medicine

## 2019-03-20 ENCOUNTER — Telehealth (INDEPENDENT_AMBULATORY_CARE_PROVIDER_SITE_OTHER): Payer: Commercial Managed Care - PPO | Admitting: Family Medicine

## 2019-03-20 VITALS — BP 107/60 | HR 80 | Wt 135.0 lb

## 2019-03-20 DIAGNOSIS — Z6791 Unspecified blood type, Rh negative: Secondary | ICD-10-CM

## 2019-03-20 DIAGNOSIS — O0990 Supervision of high risk pregnancy, unspecified, unspecified trimester: Secondary | ICD-10-CM | POA: Diagnosis not present

## 2019-03-20 DIAGNOSIS — O0993 Supervision of high risk pregnancy, unspecified, third trimester: Secondary | ICD-10-CM | POA: Diagnosis not present

## 2019-03-20 DIAGNOSIS — O09522 Supervision of elderly multigravida, second trimester: Secondary | ICD-10-CM | POA: Diagnosis not present

## 2019-03-20 DIAGNOSIS — O2223 Superficial thrombophlebitis in pregnancy, third trimester: Secondary | ICD-10-CM | POA: Diagnosis not present

## 2019-03-20 DIAGNOSIS — O09523 Supervision of elderly multigravida, third trimester: Secondary | ICD-10-CM | POA: Diagnosis not present

## 2019-03-20 DIAGNOSIS — O26899 Other specified pregnancy related conditions, unspecified trimester: Secondary | ICD-10-CM | POA: Diagnosis not present

## 2019-03-20 DIAGNOSIS — Z3A34 34 weeks gestation of pregnancy: Secondary | ICD-10-CM

## 2019-03-20 NOTE — Progress Notes (Signed)
I connected with@ on 03/20/19 at 10:00 AM EST by: telephone and verified that I am speaking with the correct person using two identifiers.  Patient is located at home and provider is located at [employer].     The purpose of this virtual visit is to provide medical care while limiting exposure to the novel coronavirus. I discussed the limitations, risks, security and privacy concerns of performing an evaluation and management service by telephone and the availability of in person appointments. I also discussed with the patient that there may be a patient responsible charge related to this service. By engaging in this virtual visit, you consent to the provision of healthcare.  Additionally, you authorize for your insurance to be billed for the services provided during this visit.  The patient expressed understanding and agreed to proceed.  The following staff members participated in the virtual visit:  Linda Hanson    PRENATAL VISIT NOTE  Subjective:  Linda Hanson is a 39 y.o. [id number] at [redacted]w[redacted]d  for phone visit for ongoing prenatal care.  She is currently monitored for the following issues for this high-risk pregnancy and has Supervision of high risk pregnancy, antepartum; Advanced maternal age in multigravida; Cervical high risk HPV (human papillomavirus) test positive; Superficial thrombophlebitis during pregnancy; and Rh negative state in antepartum period on their problem list.  Patient reports no complaints.  Contractions: Not present.  .  Movement: Present. Denies leaking of fluid.   The following portions of the patient's history were reviewed and updated as appropriate: allergies, current medications, past family history, past medical history, past social history, past surgical history and problem list.   Objective:   Vitals:   03/20/19 1006  BP: 107/60  Pulse: 80  Weight: 135 lb (61.2 kg)   Self-Obtained  Fetal Status:     Movement: Present     Assessment and Plan:  Pregnancy:  [id number] at [redacted]w[redacted]d  1. Rh negative state in antepartum period Received rhogam on 02/07/19  2. Superficial thrombophlebitis during pregnancy in third trimester No sx today   3. Supervision of high risk pregnancy, antepartum Desires natural birth without medication Discussed Water birth, given registration link Reviewed virtual tuor of hosptial and provided link in MyChart  4. Multigravida of advanced maternal age in second trimester NIPT low risk  Preterm labor symptoms and general obstetric precautions including but not limited to vaginal bleeding, contractions, leaking of fluid and fetal movement were reviewed in detail with the patient.  Return in about 2 weeks (around 04/03/2019) for Routine prenatal care, in person, 36wks.  Future Appointments  Date Time Provider Department Center  04/04/2019  9:45 AM Reva Bores, MD [facility]    Time spent on virtual visit: 15 minutes  Federico Flake, MD

## 2019-04-04 ENCOUNTER — Ambulatory Visit (INDEPENDENT_AMBULATORY_CARE_PROVIDER_SITE_OTHER): Payer: Commercial Managed Care - PPO | Admitting: Family Medicine

## 2019-04-04 ENCOUNTER — Other Ambulatory Visit: Payer: Self-pay

## 2019-04-04 VITALS — BP 112/72 | HR 72 | Wt 136.5 lb

## 2019-04-04 DIAGNOSIS — O0990 Supervision of high risk pregnancy, unspecified, unspecified trimester: Secondary | ICD-10-CM

## 2019-04-04 DIAGNOSIS — Z3A36 36 weeks gestation of pregnancy: Secondary | ICD-10-CM

## 2019-04-04 DIAGNOSIS — O09523 Supervision of elderly multigravida, third trimester: Secondary | ICD-10-CM

## 2019-04-04 DIAGNOSIS — Z113 Encounter for screening for infections with a predominantly sexual mode of transmission: Secondary | ICD-10-CM

## 2019-04-04 NOTE — Progress Notes (Signed)
   PRENATAL VISIT NOTE  Subjective:  Linda Hanson is a 39 y.o. [id number] at [redacted]w[redacted]d being seen today for ongoing prenatal care.  She is currently monitored for the following issues for this low-risk pregnancy and has Supervision of high risk pregnancy, antepartum; Advanced maternal age in multigravida; Cervical high risk HPV (human papillomavirus) test positive; Superficial thrombophlebitis during pregnancy; and Rh negative state in antepartum period on their problem list.  Patient reports no complaints.  Contractions: Not present.  .  Movement: Present. Denies leaking of fluid.   The following portions of the patient's history were reviewed and updated as appropriate: allergies, current medications, past family history, past medical history, past social history, past surgical history and problem list.   Objective:   Vitals:   04/04/19 0942  BP: 112/72  Pulse: 72  Weight: 136 lb 8 oz (61.9 kg)    Fetal Status: Fetal Heart Rate (bpm): 150 Fundal Height: 31 cm Movement: Present  Presentation: Vertex  General:  Alert, oriented and cooperative. Patient is in no acute distress.  Skin: Skin is warm and dry. No rash noted.   Cardiovascular: Normal heart rate noted  Respiratory: Normal respiratory effort, no problems with respiration noted  Abdomen: Soft, gravid, appropriate for gestational age.  Pain/Pressure: Present     Pelvic: Cervical exam performed in the presence of a chaperone Dilation: 1 Effacement (%): 20 Station: Ballotable large left labial varicosity  Extremities: Normal range of motion.  Edema: None  Mental Status: Normal mood and affect. Normal behavior. Normal judgment and thought content.   Assessment and Plan:  Pregnancy: [id number] at [redacted]w[redacted]d 1. Supervision of high risk pregnancy, antepartum Cultures today - Strep Gp B Culture+Rflx - Cervicovaginal ancillary only( [place])  2. Multigravida of advanced maternal age in third trimester Declined genetics  Preterm labor  symptoms and general obstetric precautions including but not limited to vaginal bleeding, contractions, leaking of fluid and fetal movement were reviewed in detail with the patient. Please refer to After Visit Summary for other counseling recommendations.   Return in 1 week (on 04/11/2019) for virtual.  Future Appointments  Date Time Provider Department Center  04/11/2019  1:00 PM Anyanwu, Jethro Bastos, MD [facility]  04/18/2019 10:00 AM Anyanwu, Jethro Bastos, MD [facility]  04/25/2019  9:45 AM Anyanwu, Jethro Bastos, MD [facility]    Reva Bores, MD

## 2019-04-04 NOTE — Patient Instructions (Signed)

## 2019-04-05 LAB — CERVICOVAGINAL ANCILLARY ONLY
Chlamydia: NEGATIVE
Comment: NEGATIVE
Comment: NORMAL
Neisseria Gonorrhea: NEGATIVE

## 2019-04-08 LAB — STREP GP B CULTURE+RFLX: Strep Gp B Culture+Rflx: NEGATIVE

## 2019-04-11 ENCOUNTER — Telehealth (INDEPENDENT_AMBULATORY_CARE_PROVIDER_SITE_OTHER): Payer: Commercial Managed Care - PPO | Admitting: Obstetrics & Gynecology

## 2019-04-11 ENCOUNTER — Encounter: Payer: Self-pay | Admitting: Obstetrics & Gynecology

## 2019-04-11 VITALS — BP 118/68 | HR 101 | Wt 136.0 lb

## 2019-04-11 DIAGNOSIS — O0990 Supervision of high risk pregnancy, unspecified, unspecified trimester: Secondary | ICD-10-CM

## 2019-04-11 DIAGNOSIS — O09523 Supervision of elderly multigravida, third trimester: Secondary | ICD-10-CM

## 2019-04-11 DIAGNOSIS — Z3A37 37 weeks gestation of pregnancy: Secondary | ICD-10-CM

## 2019-04-11 NOTE — Progress Notes (Signed)
OBSTETRICS PRENATAL VIRTUAL VISIT ENCOUNTER NOTE  Provider location: Center for [facility]   I connected with Linda Hanson on 04/11/19 at  1:00 PM EDT by MyChart Video Encounter at home and verified that I am speaking with the correct person using two identifiers.   I discussed the limitations, risks, security and privacy concerns of performing an evaluation and management service virtually and the availability of in person appointments. I also discussed with the patient that there may be a patient responsible charge related to this service. The patient expressed understanding and agreed to proceed. Subjective:  Linda Hanson is a 39 y.o. U2P5361 at [redacted]w[redacted]d being seen today for ongoing prenatal care.  She is currently monitored for the following issues for this high-risk pregnancy and has Supervision of high risk pregnancy, antepartum; Advanced maternal age in multigravida; Cervical high risk HPV (human papillomavirus) test positive; Superficial thrombophlebitis during pregnancy; and Rh negative state in antepartum period on their problem list.  Patient reports no complaints.  Contractions: Not present. Vag. Bleeding: None.  Movement: Present. Denies any leaking of fluid.   The following portions of the patient's history were reviewed and updated as appropriate: allergies, current medications, past family history, past medical history, past social history, past surgical history and problem list.   Objective:   Vitals:   04/11/19 1151  BP: 118/68  Pulse: (!) 101  Weight: 136 lb (61.7 kg)    Fetal Status:     Movement: Present     General:  Alert, oriented and cooperative. Patient is in no acute distress.  Respiratory: Normal respiratory effort, no problems with respiration noted  Mental Status: Normal mood and affect. Normal behavior. Normal judgment and thought content.  Rest of physical exam deferred due to type of encounter  Labs: Results for orders  placed or performed in visit on 04/04/19 (from the past 336 hour(s))  Cervicovaginal ancillary only( [place])   Collection Time: 04/04/19  9:51 AM  Result Value Ref Range   Neisseria Gonorrhea Negative    Chlamydia Negative    Comment Normal Reference Ranger Chlamydia - Negative    Comment      Normal Reference Range Neisseria Gonorrhea - Negative  Strep Gp B Culture+Rflx   Collection Time: 04/04/19  3:25 PM   Specimen: Vaginal/Rectal; Genital   VR  Result Value Ref Range   Strep Gp B Culture+Rflx Negative Negative     Assessment and Plan:  Pregnancy: [id number] at [redacted]w[redacted]d 1. Multigravida of advanced maternal age in third trimester 2. Supervision of high risk pregnancy, antepartum Stable BP, no issues.  Negative GBS and pelvic cultures. Continue weekly visits. Will get COVID vaccine soon. Term labor symptoms and general obstetric precautions including but not limited to vaginal bleeding, contractions, leaking of fluid and fetal movement were reviewed in detail with the patient. I discussed the assessment and treatment plan with the patient. The patient was provided an opportunity to ask questions and all were answered. The patient agreed with the plan and demonstrated an understanding of the instructions. The patient was advised to call back or seek an in-person office evaluation/go to MAU at [facility] for any urgent or concerning symptoms. Please refer to After Visit Summary for other counseling recommendations.   I provided 7 minutes of face-to-face time during this encounter.  Return in about 1 week (around 04/18/2019) for Virtual OB Visit.  Future Appointments  Date Time Provider Department Center  04/11/2019  1:00 PM Rayford Williamsen, Vela Prose  A, MD [facility]  04/18/2019 10:00 AM Charde Macfarlane, Sallyanne Havers, MD [facility]  04/25/2019  9:45 AM Asyia Hornung, Sallyanne Havers, MD [facility]    Verita Schneiders, MD Center for [facility] [place]

## 2019-04-11 NOTE — Patient Instructions (Signed)
Return to office for any scheduled appointments. Call the office or go to the [facility] [place] if:  You begin to have strong, frequent contractions  Your water breaks.  Sometimes it is a big gush of fluid, sometimes it is just a trickle that keeps getting your panties wet or running down your legs  You have vaginal bleeding.  It is normal to have a small amount of spotting if your cervix was checked.   You do not feel your baby moving like normal.  If you do not, get something to eat and drink and lay down and focus on feeling your baby move.   If your baby is still not moving like normal, you should call the office or go to MAU.  Any other obstetric concerns.   

## 2019-04-11 NOTE — Progress Notes (Signed)
I connected with  Linda Hanson on 04/11/19 at  1:00 PM EDT by telephone and verified that I am speaking with the correct person using two identifiers.   I discussed the limitations, risks, security and privacy concerns of performing an evaluation and management service by telephone and the availability of in person appointments. I also discussed with the patient that there may be a patient responsible charge related to this service. The patient expressed understanding and agreed to proceed.  Demetrice Emeline Darling, CMA 04/11/2019  11:49 AM

## 2019-04-18 ENCOUNTER — Encounter: Payer: Self-pay | Admitting: Obstetrics & Gynecology

## 2019-04-18 ENCOUNTER — Other Ambulatory Visit: Payer: Self-pay

## 2019-04-18 ENCOUNTER — Telehealth (INDEPENDENT_AMBULATORY_CARE_PROVIDER_SITE_OTHER): Payer: Commercial Managed Care - PPO | Admitting: Obstetrics & Gynecology

## 2019-04-18 VITALS — BP 107/67

## 2019-04-18 DIAGNOSIS — O09523 Supervision of elderly multigravida, third trimester: Secondary | ICD-10-CM

## 2019-04-18 DIAGNOSIS — Z3A38 38 weeks gestation of pregnancy: Secondary | ICD-10-CM

## 2019-04-18 DIAGNOSIS — O0990 Supervision of high risk pregnancy, unspecified, unspecified trimester: Secondary | ICD-10-CM

## 2019-04-18 NOTE — Progress Notes (Signed)
   OBSTETRICS PRENATAL VIRTUAL VISIT ENCOUNTER NOTE  Provider location: Center for [facility] at [facility]   I connected with [name] on 04/18/19 at 10:00 AM EDT by MyChart Video Encounter at home and verified that I am speaking with the correct person using two identifiers.   I discussed the limitations, risks, security and privacy concerns of performing an evaluation and management service virtually and the availability of in person appointments. I also discussed with the patient that there may be a patient responsible charge related to this service. The patient expressed understanding and agreed to proceed. Subjective:  Linda Hanson is a 39 y.o. [id number] at [redacted]w[redacted]d being seen today for ongoing prenatal care.  She is currently monitored for the following issues for this high-risk pregnancy and has Supervision of high risk pregnancy, antepartum; Advanced maternal age in multigravida; Cervical high risk HPV (human papillomavirus) test positive; Superficial thrombophlebitis during pregnancy; and Rh negative state in antepartum period on their problem list.  Patient reports no complaints.  Contractions: Irregular.  .  Movement: Present. Denies any leaking of fluid.   The following portions of the patient's history were reviewed and updated as appropriate: allergies, current medications, past family history, past medical history, past social history, past surgical history and problem list.   Objective:   Vitals:   04/18/19 1002  BP: 107/67    Fetal Status:     Movement: Present     General:  Alert, oriented and cooperative. Patient is in no acute distress.  Respiratory: Normal respiratory effort, no problems with respiration noted  Mental Status: Normal mood and affect. Normal behavior. Normal judgment and thought content.  Rest of physical exam deferred due to type of encounter   Assessment and Plan:  Pregnancy: [id number] at [redacted]w[redacted]d 1. Multigravida of advanced maternal age  in third trimester 2. Supervision of high risk pregnancy, antepartum Wants to continue virtual weekly visits for now. In office visit for NST when postdates, will also start weekly BPP then. Patient informed and agrees with this plan. Term labor symptoms and general obstetric precautions including but not limited to vaginal bleeding, contractions, leaking of fluid and fetal movement were reviewed in detail with the patient. I discussed the assessment and treatment plan with the patient. The patient was provided an opportunity to ask questions and all were answered. The patient agreed with the plan and demonstrated an understanding of the instructions. The patient was advised to call back or seek an in-person office evaluation/go to MAU at [facility] for any urgent or concerning symptoms. Please refer to After Visit Summary for other counseling recommendations.   I provided 11 minutes of face-to-face time during this encounter.  Return in about 1 week (around 04/25/2019) for Virtual OB Visit.  Future Appointments  Date Time Provider Department Center  04/25/2019  9:45 AM Lakeysha Slutsky, Jethro Bastos, MD [facility]    Jaynie Collins, MD Center for [facility], [facility]

## 2019-04-18 NOTE — Patient Instructions (Addendum)
Return to office for any scheduled appointments. Call the office or go to the MA[facility] at [facility] [place] if:  You begin to have strong, frequent contractions  Your water breaks.  Sometimes it is a big gush of fluid, sometimes it is just a trickle that keeps getting your panties wet or running down your legs  You have vaginal bleeding.  It is normal to have a small amount of spotting if your cervix was checked.   You do not feel your baby moving like normal.  If you do not, get something to eat and drink and lay down and focus on feeling your baby move.   If your baby is still not moving like normal, you should call the office or go to MAU.  Any other obstetric concerns.   

## 2019-04-25 ENCOUNTER — Telehealth (INDEPENDENT_AMBULATORY_CARE_PROVIDER_SITE_OTHER): Payer: Commercial Managed Care - PPO | Admitting: Obstetrics & Gynecology

## 2019-04-25 ENCOUNTER — Encounter: Payer: Self-pay | Admitting: Obstetrics & Gynecology

## 2019-04-25 ENCOUNTER — Other Ambulatory Visit: Payer: Self-pay

## 2019-04-25 VITALS — BP 116/65 | HR 71 | Wt 137.5 lb

## 2019-04-25 DIAGNOSIS — O0990 Supervision of high risk pregnancy, unspecified, unspecified trimester: Secondary | ICD-10-CM

## 2019-04-25 DIAGNOSIS — Z3A39 39 weeks gestation of pregnancy: Secondary | ICD-10-CM

## 2019-04-25 DIAGNOSIS — O09523 Supervision of elderly multigravida, third trimester: Secondary | ICD-10-CM

## 2019-04-25 NOTE — Patient Instructions (Addendum)
Return to office for any scheduled appointments. Call the office or go to the MA[facility] at [facility] [place] if:  You begin to have strong, frequent contractions  Your water breaks.  Sometimes it is a big gush of fluid, sometimes it is just a trickle that keeps getting your panties wet or running down your legs  You have vaginal bleeding.  It is normal to have a small amount of spotting if your cervix was checked.   You do not feel your baby moving like normal.  If you do not, get something to eat and drink and lay down and focus on feeling your baby move.   If your baby is still not moving like normal, you should call the office or go to MAU.  Any other obstetric concerns.   

## 2019-04-25 NOTE — Progress Notes (Signed)
   OBSTETRICS PRENATAL VIRTUAL VISIT ENCOUNTER NOTE  Provider location: Center for [facility] at [facility]   I connected with [name] on 04/25/19 at  9:45 AM EDT by MyChart Video Encounter at home and verified that I am speaking with the correct person using two identifiers.   I discussed the limitations, risks, security and privacy concerns of performing an evaluation and management service virtually and the availability of in person appointments. I also discussed with the patient that there may be a patient responsible charge related to this service. The patient expressed understanding and agreed to proceed. Subjective:  Anndrea Mihelich is a 39 y.o. P6P9509 at [redacted]w[redacted]d being seen today for ongoing prenatal care.  She is currently monitored for the following issues for this high-risk pregnancy and has Supervision of high risk pregnancy, antepartum; Advanced maternal age in multigravida; Cervical high risk HPV (human papillomavirus) test positive; Superficial thrombophlebitis during pregnancy; and Rh negative state in antepartum period on their problem list.  Patient reports no complaints.  Contractions: Irregular. Vag. Bleeding: None.  Movement: Present. Denies any leaking of fluid.   The following portions of the patient's history were reviewed and updated as appropriate: allergies, current medications, past family history, past medical history, past social history, past surgical history and problem list.   Objective:   Vitals:   04/25/19 0942  BP: 116/65  Pulse: 71  Weight: 137 lb 8 oz (62.4 kg)    Fetal Status:     Movement: Present     General:  Alert, oriented and cooperative. Patient is in no acute distress.  Respiratory: Normal respiratory effort, no problems with respiration noted  Mental Status: Normal mood and affect. Normal behavior. Normal judgment and thought content.  Rest of physical exam deferred due to type of encounter   Assessment and Plan:    Pregnancy: T2I7124 at [redacted]w[redacted]d 1. Multigravida of advanced maternal age in third trimester 2. Supervision of high risk pregnancy, antepartum Discussed that patient will be postterm next week, antenatal testing recommended.  Patient does not want to do BPP at [facility], but agrees to do office NST and AFI (modified BPP) weekly. Does not want IOL scheduled until 41 weeks; and will want this to be scheduled at 42 weeks if still pregnant.  She is aware of the concern about increased risk of intrauterine fetal demise in the post term period.   Term labor symptoms and general obstetric precautions including but not limited to vaginal bleeding, contractions, leaking of fluid and fetal movement were reviewed in detail with the patient. I discussed the assessment and treatment plan with the patient. The patient was provided an opportunity to ask questions and all were answered. The patient agreed with the plan and demonstrated an understanding of the instructions. The patient was advised to call back or seek an in-person office evaluation/go to MAU at [facility] for any urgent or concerning symptoms. Please refer to After Visit Summary for other counseling recommendations.   I provided 10 minutes of face-to-face time during this encounter.  Return in about 1 week (around 05/02/2019) for OFFICE OB Visit, NST, AFI in office (and the next week also).  No future appointments.  Jaynie Collins, MD Center for [employer], [facility]

## 2019-04-26 MED ORDER — AMMONIA AROMATIC IN INHA
RESPIRATORY_TRACT | Status: AC
  Filled 2019-04-26: qty 10

## 2019-04-28 ENCOUNTER — Encounter (HOSPITAL_COMMUNITY): Payer: Self-pay | Admitting: Obstetrics & Gynecology

## 2019-04-28 ENCOUNTER — Other Ambulatory Visit: Payer: Self-pay

## 2019-04-28 ENCOUNTER — Inpatient Hospital Stay (HOSPITAL_COMMUNITY)
Admission: AD | Admit: 2019-04-28 | Discharge: 2019-04-30 | DRG: 807 | Disposition: A | Discharge status: Home / Self Care | Payer: Commercial Managed Care - PPO | Attending: Obstetrics and Gynecology | Admitting: Obstetrics and Gynecology

## 2019-04-28 ENCOUNTER — Inpatient Hospital Stay (HOSPITAL_COMMUNITY): Admission: AD | Admit: 2019-04-28 | Payer: Commercial Managed Care - PPO | Source: Home / Self Care

## 2019-04-28 DIAGNOSIS — O09529 Supervision of elderly multigravida, unspecified trimester: Secondary | ICD-10-CM

## 2019-04-28 DIAGNOSIS — O2220 Superficial thrombophlebitis in pregnancy, unspecified trimester: Secondary | ICD-10-CM | POA: Diagnosis present

## 2019-04-28 DIAGNOSIS — Z3A40 40 weeks gestation of pregnancy: Secondary | ICD-10-CM

## 2019-04-28 DIAGNOSIS — O26893 Other specified pregnancy related conditions, third trimester: Secondary | ICD-10-CM | POA: Diagnosis present

## 2019-04-28 DIAGNOSIS — Z9889 Other specified postprocedural states: Secondary | ICD-10-CM | POA: Diagnosis not present

## 2019-04-28 DIAGNOSIS — O0990 Supervision of high risk pregnancy, unspecified, unspecified trimester: Secondary | ICD-10-CM

## 2019-04-28 DIAGNOSIS — Z6791 Unspecified blood type, Rh negative: Secondary | ICD-10-CM

## 2019-04-28 DIAGNOSIS — Z8672 Personal history of thrombophlebitis: Secondary | ICD-10-CM | POA: Diagnosis not present

## 2019-04-28 DIAGNOSIS — R87810 Cervical high risk human papillomavirus (HPV) DNA test positive: Secondary | ICD-10-CM | POA: Diagnosis present

## 2019-04-28 DIAGNOSIS — O1205 Gestational edema, complicating the puerperium: Secondary | ICD-10-CM | POA: Diagnosis present

## 2019-04-28 DIAGNOSIS — Z880 Allergy status to penicillin: Secondary | ICD-10-CM | POA: Diagnosis not present

## 2019-04-28 DIAGNOSIS — O4292 Full-term premature rupture of membranes, unspecified as to length of time between rupture and onset of labor: Principal | ICD-10-CM | POA: Diagnosis present

## 2019-04-28 DIAGNOSIS — O26899 Other specified pregnancy related conditions, unspecified trimester: Secondary | ICD-10-CM

## 2019-04-28 DIAGNOSIS — Z20822 Contact with and (suspected) exposure to covid-19: Secondary | ICD-10-CM | POA: Diagnosis present

## 2019-04-28 LAB — TYPE AND SCREEN
ABO/RH(D): A NEG
Antibody Screen: POSITIVE

## 2019-04-28 LAB — CBC
HCT: 39.1 % (ref 36.0–46.0)
Hemoglobin: 13 g/dL (ref 12.0–15.0)
MCH: 31.6 pg (ref 26.0–34.0)
MCHC: 33.2 g/dL (ref 30.0–36.0)
MCV: 95.1 fL (ref 80.0–100.0)
Platelets: 173 10*3/uL (ref 150–400)
RBC: 4.11 MIL/uL (ref 3.87–5.11)
RDW: 12.9 % (ref 11.5–15.5)
WBC: 7 10*3/uL (ref 4.0–10.5)
nRBC: 0 % (ref 0.0–0.2)

## 2019-04-28 LAB — SARS CORONAVIRUS 2 (TAT 6-24 HRS): SARS Coronavirus 2: NEGATIVE

## 2019-04-28 LAB — POCT FERN TEST: POCT Fern Test: POSITIVE

## 2019-04-28 LAB — RPR: RPR Ser Ql: NONREACTIVE

## 2019-04-28 MED ORDER — MISOPROSTOL 200 MCG PO TABS
1000.0000 ug | ORAL_TABLET | Freq: Once | ORAL | Status: AC
  Administered 2019-04-28: 1000 ug via RECTAL

## 2019-04-28 MED ORDER — ONDANSETRON HCL 4 MG/2ML IJ SOLN: 4.0000 mg | Freq: Four times a day (QID) | INTRAMUSCULAR | Status: DC | PRN

## 2019-04-28 MED ORDER — SOD CITRATE-CITRIC ACID 500-334 MG/5ML PO SOLN: 30.0000 mL | ORAL | Status: DC | PRN

## 2019-04-28 MED ORDER — LACTATED RINGERS IV SOLN: INTRAVENOUS | Status: DC

## 2019-04-28 MED ORDER — OXYTOCIN 40 UNITS IN NORMAL SALINE INFUSION - SIMPLE MED: 2.5000 [IU]/h | INTRAVENOUS | Status: DC

## 2019-04-28 MED ORDER — LACTATED RINGERS IV SOLN: 500.0000 mL | INTRAVENOUS | Status: DC | PRN

## 2019-04-28 MED ORDER — OXYTOCIN 10 UNIT/ML IJ SOLN
INTRAMUSCULAR | Status: AC
  Administered 2019-04-28: 10 [IU]
  Filled 2019-04-28: qty 1

## 2019-04-28 MED ORDER — FENTANYL CITRATE (PF) 100 MCG/2ML IJ SOLN: 50.0000 ug | INTRAMUSCULAR | Status: DC | PRN

## 2019-04-28 MED ORDER — LIDOCAINE HCL (PF) 1 % IJ SOLN
30.0000 mL | INTRAMUSCULAR | Status: AC | PRN
  Administered 2019-04-28: 30 mL via SUBCUTANEOUS
  Filled 2019-04-28: qty 30

## 2019-04-28 MED ORDER — MISOPROSTOL 200 MCG PO TABS
ORAL_TABLET | ORAL | Status: AC
  Filled 2019-04-28: qty 5

## 2019-04-28 MED ORDER — ACETAMINOPHEN 325 MG PO TABS: 650.0000 mg | ORAL_TABLET | ORAL | Status: DC | PRN

## 2019-04-28 MED ORDER — OXYTOCIN BOLUS FROM INFUSION: 500.0000 mL | Freq: Once | INTRAVENOUS | Status: DC

## 2019-04-28 NOTE — Progress Notes (Signed)
   Linda Hanson is a 39 y.o. J6G8366 at [redacted]w[redacted]d  admitted for rupture of membranes.   Subjective: Trying to ambulate in room, is trying to get labor started. She does not want any interventions at this time.   Objective: Vitals:   04/28/19 0950 04/28/19 1041 04/28/19 1144 04/28/19 1243  BP: 124/76 114/66 128/78 119/67  Pulse: 78 (!) 103 (!) 101 74  Resp: 18 18 18    Temp:  98.5 F (36.9 C)  (!) 97.4 F (36.3 C)  TempSrc:  Oral  Oral  SpO2:      Weight:      Height:       No intake/output data recorded.  FHT:  FHR: 140 bpm, variability: moderate,  accelerations:  Present,  decelerations:  Absent UC:   Irregular q 10 min SVE:   Dilation: 1.5 Effacement (%): Thick Exam by:: [email or web address] CNM   Labs: Lab Results  Component Value Date   WBC 7.0 04/28/2019   HGB 13.0 04/28/2019   HCT 39.1 04/28/2019   MCV 95.1 04/28/2019   PLT 173 04/28/2019    Assessment / Plan: early labor, has only been SROM since 5 am and desires to continue to try for unmedicated birth. will continue to ambulate in the hallway.   Labor: early labor; will check cervix at 5 pm.  Fetal Wellbeing:  Category I Pain Control:  Labor support without medications and Epidural Anticipated MOD:  NSVD  04/30/2019 Linda Hanson 04/28/2019, 1:26 PM

## 2019-04-28 NOTE — Progress Notes (Addendum)
Labor Progress Note Linda Hanson is a 39 y.o. [id number] at [redacted]w[redacted]d presented for SROM S: Pt reports contractions feel stronger and has no complaints.   O:  BP 116/67   Pulse 79   Temp 98.1 F (36.7 C) (Oral)   Resp 16   Ht 5\' 2"  (1.575 m)   Wt 63.4 kg   LMP 07/22/2018   SpO2 100%   BMI 25.57 kg/m  EFM: 140 bpm/moderate variability /15x15 accelerations present/ decelerations absent/ contractions every 4-7 min.  CVE: Dilation: 1.5 Effacement (%): 70 Presentation: Vertex Exam by:: Kooistra, CNM   A&P: 39 y.o. 30 [redacted]w[redacted]d SROM #Labor: Progressing. Pt may have regular diet and ambulate as needed. Pt request a non-intervention labor though expresses openness to of pit if no progression. Will perform cervical exam at midnight and suggest pit for augmentation if appropriate at that time.  #Pain: per pt request. #FWB: Cat 1 #GBS negative chronic/other problems: Cervical High Risk HPV Rh Negative state   [redacted]w[redacted]d, MS3  9:45 PM  GME ATTESTATION:  I saw and evaluated the patient. I agree with the findings and the plan of care as documented in the student's note.  Laural Benes, DO OB Fellow, Faculty [facility], Center for [facility] 04/28/2019 10:06 PM

## 2019-04-28 NOTE — Progress Notes (Signed)
Called to bedisde by RN for SVE  SVE: 8/90/-1 FHR: Cat I  Anticipate NSVD  Ananya Mccleese, Margarette Asal, DO OB Fellow, Faculty Practice 04/28/2019 10:24 PM

## 2019-04-28 NOTE — Discharge Summary (Signed)
Postpartum Discharge Summary    Patient Name: Linda Hanson DOB: 1980/01/20 MRN: [id number]  Date of admission: 04/28/2019 Delivering Provider: Merilyn Baba   Date of discharge: 04/30/2019  Admitting diagnosis: Normal labor and delivery [O80] Intrauterine pregnancy: [redacted]w[redacted]d    Secondary diagnosis:  Active Problems:   Supervision of high risk pregnancy, antepartum   Advanced maternal age in multigravida   Cervical high risk HPV (human papillomavirus) test positive   Superficial thrombophlebitis during pregnancy   Rh negative state in antepartum period   Normal labor and delivery  Additional problems: Advanced Maternal Age, PROM     Discharge diagnosis: Term Pregnancy Delivered                                                                                                Post partum procedures:None  Augmentation: None  Complications: None  Hospital course:  Onset of Labor With Vaginal Delivery     39y.o. yo [id number] admitted in Latent Labor with PROM on 04/28/2019. Patient had an uncomplicated labor course as follows:   She was admitted after SROM 0500 on 4/18 and progressed without augmentation to complete and delivered shortly after.  Membrane Rupture Time/Date: 5:30 AM ,04/28/2019   Intrapartum Procedures: Episiotomy: None [1]                                         Lacerations:  Periurethral [8];Labial [10]  Patient had a delivery of a Viable infant. 04/28/2019  Information for the patient's newborn:  BaGrasiela, Jonsson0[[id number]]Delivery Method: Vag-Spont     Patient's postpartum course complicated by L leg swelling suspicious for thrombophlebitis, patient had LLE duplex which showed age indeterminate superficial vein thrombosis. Discussed given postpartum period and increased risk of VTE, that would recommend 6 weeks of anticoagulation with Lovenox. Patient declined after discussion of benefits of medication and risks of not anticoagulating.  Discussed symptoms of DVT or PE that would warrant immediate emergency medical care. She voiced understanding. She is ambulating, tolerating a regular diet, passing flatus, and urinating well. Patient is discharged home in stable condition on 04/30/19.  Delivery time: 10:45 PM    Magnesium Sulfate received: No BMZ received: No Rhophylac:No, infant also Rh negative MMR:N/A Transfusion:No  Physical exam  Vitals:   04/29/19 1006 04/29/19 1500 04/29/19 2200 04/30/19 0533  BP: 111/76 100/69 111/79 105/73  Pulse: 74 66 68 (!) 48  Resp: _0 Temp: 98 F (36.7 C) 98 F (36.7 C) 98.7 F (37.1 C) 98.1 F (36.7 C)  TempSrc: Oral Oral Oral Oral  SpO2: 99%  100%   Weight:      Height:       General: alert, cooperative and no distress Lochia: appropriate Uterine Fundus: firm Incision: N/A DVT Evaluation: no LE pretibial edema. Palpable cord in superior posterior L calf and L perineum. No significant calf/ankle edema. Labs: Lab Results  Component Value Date   WBC 17.2 (H) 04/29/2019   HGB  12.2 04/29/2019   HCT 36.5 04/29/2019   MCV 95.5 04/29/2019   PLT 186 04/29/2019   CMP Latest Ref Rng & Units 11/09/2018  Glucose 70 - 99 mg/dL 89   Edinburgh Score: Edinburgh Postnatal Depression Scale Screening Tool 04/29/2019  I have been able to laugh and see the funny side of things. 0  I have looked forward with enjoyment to things. 0  I have blamed myself unnecessarily when things went wrong. 1  I have been anxious or worried for no good reason. 1  I have felt scared or panicky for no good reason. 0  Things have been getting on top of me. 0  I have been so unhappy that I have had difficulty sleeping. 0  I have felt sad or miserable. 0  I have been so unhappy that I have been crying. 1  The thought of harming myself has occurred to me. 0  Edinburgh Postnatal Depression Scale Total 3    Discharge instruction: per After Visit Summary and "Baby and Me Booklet".  After visit  meds:  Allergies as of 04/30/2019      Reactions   Penicillin G Hives      Medication List    TAKE these medications   acetaminophen 325 MG tablet Commonly known as: Tylenol Take 2 tablets (650 mg total) by mouth every 4 (four) hours as needed (for pain scale < 4).   ibuprofen 600 MG tablet Commonly known as: ADVIL Take 1 tablet (600 mg total) by mouth every 6 (six) hours.   polyethylene glycol powder 17 GM/SCOOP powder Commonly known as: GLYCOLAX/MIRALAX Take 17 g by mouth daily as needed.   Prenatal + DHA 27-1 & 250 MG Thpk Take 1 tablet by mouth daily.       Diet: routine diet  Activity: Advance as tolerated. Pelvic rest for 6 weeks.   Outpatient follow up:6 weeks Follow up Appt: Future Appointments  Date Time Provider [place]  06/11/2019 10:00 AM Donnamae Jude, MD [facility]   Follow up Visit:   Please schedule this patient for Postpartum visit in: 6 weeks with the following provider: Any provider Virtual ok For C/S patients schedule nurse incision check in weeks 2 weeks: no Low risk pregnancy complicated by: AMA Delivery mode:  SVD Anticipated Birth Control:  Vasectomy  PP Procedures needed: Colpo Schedule Integrated [place] visit: no  Newborn Data: Live born female  Birth Weight:  3365 grams APGAR: 9, 9  Newborn Delivery   Birth date/time: 04/28/2019 22:45:00 Delivery type: Vaginal, Spontaneous      Baby Feeding: Breast Disposition:home with mother   04/30/2019 Melina Schools, DO

## 2019-04-28 NOTE — MAU Note (Signed)
Pt reports to MAU reporting her water broke at 0545 am, large gush of fluid that was clear. Denies VB, confirms good movement.

## 2019-04-28 NOTE — Discharge Instructions (Signed)

## 2019-04-28 NOTE — MAU Note (Signed)
Pt states that she would like to go as natural as possible.  Pt refusing IV access for now.

## 2019-04-28 NOTE — H&P (Signed)
Linda Hanson is a 39 y.o. female [phone] with IUP at [redacted]w[redacted]d presenting for SROM at 5 am clear. Pt states she has been having irregular, every 8 minutes contractions, associated with none vaginal bleeding for 4  hours..  Membranes are intact, with active fetal movement.   [facility] at [facility] since 10  wks  Prenatal History/Complications:  Past Medical History: Past Medical History:  Diagnosis Date  . Vaginal Pap smear, abnormal    hpv    Past Surgical History: History reviewed. No pertinent surgical history.  Obstetrical History: OB History    Gravida  6   Para  2   Term  2   Preterm      AB  3   Living  2     SAB  3   TAB      Ectopic      Multiple      Live Births  2            Social History: Social History   Socioeconomic History  . Marital status: Married    Spouse name: [name]  . Number of children: 2  . Years of education: Master's   . Highest education level: Not on file  Occupational History  . Occupation: Water quality scientist  Tobacco Use  . Smoking status: Never Smoker  . Smokeless tobacco: Never Used  Substance and Sexual Activity  . Alcohol use: Not Currently    Alcohol/week: 1.0 standard drinks    Types: 1 Cans of beer per week  . Drug use: Never  . Sexual activity: Yes    Comment: pregnant  Other Topics Concern  . 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 Difficulty of Paying Living Expenses: Not hard at all  Food Insecurity:   . Worried About Programme researcher, broadcasting/film/video  in the Last Year:   . Barista in the Last Year:   Transportation Needs:   . Freight forwarder (Medical):   Marland Kitchen Lack of Transportation (Non-Medical):   Physical Activity:   . Days of Exercise per Week:   . Minutes of Exercise per Session:   Stress:   . Feeling of Stress :   Social Connections:   . Frequency of Communication with Friends and Family:   . Frequency of Social Gatherings with Friends and Family:   . Attends Religious Services:   . Active Member of Clubs or Organizations:   . Attends Banker Meetings:   Marland Kitchen Marital Status:     Family History: Family History  Problem Relation Age of Onset  . Diabetes Mother   . Arthritis Father   . Hyperlipidemia Father   . Hypertension Father   . Stroke Father 4  . Learning disabilities Sister        reading  . Heart disease Maternal Grandmother   .  Hyperlipidemia Maternal Grandmother   . Heart attack Maternal Grandmother 80  . Alzheimer's disease Maternal Grandfather   . Alzheimer's disease Paternal Grandmother   . Arthritis Paternal Grandfather   . Prostate cancer Paternal Grandfather     Allergies: Allergies  Allergen Reactions  . Penicillin G Hives    Medications Prior to Admission  Medication Sig Dispense Refill Last Dose  . Prenatal 28-0.8 MG TABS Take by mouth.   04/27/2019 at Unknown time  . Prenatal-FeFum-FA-DHA w/o A (PRENATAL + DHA) 27-1 & 250 MG THPK Take 1 tablet by mouth daily. 30 each 11         Review of Systems   Constitutional: Negative for fever and chills Eyes: Negative for visual disturbances Respiratory: Negative for shortness of breath, dyspnea Cardiovascular: Negative for chest pain or palpitations  Gastrointestinal: Negative for vomiting, diarrhea and constipation.  POSITIVE for abdominal pain (contractions) Genitourinary: Negative for dysuria and urgency Musculoskeletal: Negative for back pain, joint pain, myalgias  Neurological: Negative for dizziness and  headaches      Blood pressure 107/68, pulse 75, temperature 98.1 F (36.7 C), temperature source Oral, resp. rate 18, height 5\' 2"  (1.575 m), weight 63.4 kg, last menstrual period 07/22/2018, SpO2 100 %. General appearance: alert, cooperative and no distress Lungs: normal respiratory effort Heart: regular rate and rhythm Abdomen: soft, non-tender; bowel sounds normal Extremities: Homans sign is negative, no sign of DVT DTR's 2+ Presentation: cephalic Fetal monitoring  Baseline: 130 bpm, mod var, present acel, neg decels, no contractions.  Uterine activity  q8 min Dilation: 1.5 Effacement (%): Thick Exam by:: [email or web address] CNM   Prenatal labs: ABO, Rh: --/--/A NEG (04/18 0757) Antibody: POS (04/18 0757) Rubella: 2.21 (09/23 1028) RPR: Non Reactive (01/28 0822)  HBsAg: Negative (09/23 1028)  HIV: Non Reactive (01/28 02-10-1987)  GBS: Negative/-- (03/25 1525)  1 hr Glucola 80 70 143 Genetic screening   Anatomy 01-06-1998 normal female   Prenatal Transfer Tool  Maternal Diabetes: No Genetic Screening: Declined Maternal Ultrasounds/Referrals: Normal Fetal Ultrasounds or other Referrals:  None Maternal Substance Abuse:  No Significant Maternal Medications:  None Significant Maternal Lab Results: None     Results for orders placed or performed during the hospital encounter of 04/28/19 (from the past 24 hour(s))  CBC   Collection Time: 04/28/19  7:52 AM  Result Value Ref Range   WBC 7.0 4.0 - 10.5 K/uL   RBC 4.11 3.87 - 5.11 MIL/uL   Hemoglobin 13.0 12.0 - 15.0 g/dL   HCT 04/30/19 56.3 - 89.3 %   MCV 95.1 80.0 - 100.0 fL   MCH 31.6 26.0 - 34.0 pg   MCHC 33.2 30.0 - 36.0 g/dL   RDW 73.4 28.7 - 68.1 %   Platelets 173 150 - 400 K/uL   nRBC 0.0 0.0 - 0.2 %  Fern Test   Collection Time: 04/28/19  7:53 AM  Result Value Ref Range   POCT Fern Test Positive = ruptured amniotic membanes   Type and screen [facility]   Collection Time: 04/28/19  7:57 AM  Result Value Ref Range    ABO/RH(D) A NEG    Antibody Screen POS    Sample Expiration      05/01/2019,2359 Performed at [facility], [address], [place], [place]    Antibody Identification PENDING     Assessment: Otto Caraway is a 39 y.o. 30 with an IUP at [redacted]w[redacted]d presenting for SROM at home. Cervix is 1.5, thick, middle  position.  Plan to try ambulating, nipple stimulation and consider interventions later this afternoon if not progressing. Patient is nervous about interventions because she wants an unmedicated childbirth, reassurance and guidance provided.   Plan: #Labor: expectant management #Pain:  Per request #FWB Cat 1 #ID: GBS: Neg #MOF:  breast #MOC: undecided #Circ: NA   Mervyn Skeeters  04/28/2019, 9:15 AM

## 2019-04-28 NOTE — Progress Notes (Signed)
Patient Margaretann Abate is a 39 y.o. [id number]  at [redacted]w[redacted]d here with SROM at 5 am this morning. She has been ambulating in the hallway and in her room; cervix is still 2.5, -2 position but feels thinner (60-70%). Patient states that she feels she is moving into active labor as her contractions are getting stronger; at this point she does not want any interventions. Will continue to monitor closely, patient may have regular diet and ambulate as needed and have intermittent monitoring.   Patient Vitals for the past 24 hrs:  BP Temp Temp src Pulse Resp SpO2 Height Weight  04/28/19 1636 -- 97.7 F (36.5 C) Oral -- -- -- -- --  04/28/19 1557 118/67 -- -- 78 17 -- -- --  04/28/19 1443 124/68 98.1 F (36.7 C) Oral 86 -- -- -- --  04/28/19 1350 105/65 -- -- 62 16 -- -- --  04/28/19 1243 119/67 (!) 97.4 F (36.3 C) Oral 74 -- -- -- --  04/28/19 1144 128/78 -- -- (!) 101 18 -- -- --  04/28/19 1041 114/66 98.5 F (36.9 C) Oral (!) 103 18 -- -- --  04/28/19 0950 124/76 -- -- 78 18 -- -- --  04/28/19 0844 107/68 98.1 F (36.7 C) Oral 75 -- -- -- --  04/28/19 0838 -- -- -- -- 18 -- 5\' 2"  (1.575 m) --  04/28/19 0710 115/82 97.6 F (36.4 C) Oral 71 16 100 % -- --  04/28/19 0708 -- -- -- -- -- -- -- 63.4 kg    04/30/19

## 2019-04-29 ENCOUNTER — Encounter (HOSPITAL_COMMUNITY): Payer: Self-pay | Admitting: Obstetrics and Gynecology

## 2019-04-29 LAB — CBC
HCT: 36.5 % (ref 36.0–46.0)
Hemoglobin: 12.2 g/dL (ref 12.0–15.0)
MCH: 31.9 pg (ref 26.0–34.0)
MCHC: 33.4 g/dL (ref 30.0–36.0)
MCV: 95.5 fL (ref 80.0–100.0)
Platelets: 186 10*3/uL (ref 150–400)
RBC: 3.82 MIL/uL — ABNORMAL LOW (ref 3.87–5.11)
RDW: 13 % (ref 11.5–15.5)
WBC: 17.2 10*3/uL — ABNORMAL HIGH (ref 4.0–10.5)
nRBC: 0 % (ref 0.0–0.2)

## 2019-04-29 MED ORDER — SENNOSIDES-DOCUSATE SODIUM 8.6-50 MG PO TABS: 2.0000 | ORAL_TABLET | ORAL | Status: DC

## 2019-04-29 MED ORDER — ACETAMINOPHEN 325 MG PO TABS: 650.0000 mg | ORAL_TABLET | ORAL | Status: DC | PRN

## 2019-04-29 MED ORDER — METHYLERGONOVINE MALEATE 0.2 MG/ML IJ SOLN
0.2000 mg | Freq: Once | INTRAMUSCULAR | Status: AC
  Administered 2019-04-29: 0.2 mg via INTRAMUSCULAR

## 2019-04-29 MED ORDER — OXYCODONE HCL 5 MG PO TABS: 5.0000 mg | ORAL_TABLET | ORAL | Status: DC | PRN

## 2019-04-29 MED ORDER — DIPHENHYDRAMINE HCL 25 MG PO CAPS: 25.0000 mg | ORAL_CAPSULE | Freq: Four times a day (QID) | ORAL | Status: DC | PRN

## 2019-04-29 MED ORDER — DIBUCAINE (PERIANAL) 1 % EX OINT: 1.0000 "application " | TOPICAL_OINTMENT | CUTANEOUS | Status: DC | PRN

## 2019-04-29 MED ORDER — ZOLPIDEM TARTRATE 5 MG PO TABS: 5.0000 mg | ORAL_TABLET | Freq: Every evening | ORAL | Status: DC | PRN

## 2019-04-29 MED ORDER — PRENATAL MULTIVITAMIN CH
1.0000 | ORAL_TABLET | Freq: Every day | ORAL | Status: DC
  Administered 2019-04-29 – 2019-04-30 (×2): 1 via ORAL
  Filled 2019-04-29 (×2): qty 1

## 2019-04-29 MED ORDER — COCONUT OIL OIL
1.0000 "application " | TOPICAL_OIL | Status: DC | PRN
  Administered 2019-04-29: 1 via TOPICAL

## 2019-04-29 MED ORDER — BENZOCAINE-MENTHOL 20-0.5 % EX AERO: 1.0000 "application " | INHALATION_SPRAY | CUTANEOUS | Status: DC | PRN

## 2019-04-29 MED ORDER — TETANUS-DIPHTH-ACELL PERTUSSIS 5-2.5-18.5 LF-MCG/0.5 IM SUSP: 0.5000 mL | Freq: Once | INTRAMUSCULAR | Status: DC

## 2019-04-29 MED ORDER — WITCH HAZEL-GLYCERIN EX PADS: 1.0000 "application " | MEDICATED_PAD | CUTANEOUS | Status: DC | PRN

## 2019-04-29 MED ORDER — ONDANSETRON HCL 4 MG PO TABS: 4.0000 mg | ORAL_TABLET | ORAL | Status: DC | PRN

## 2019-04-29 MED ORDER — OXYCODONE HCL 5 MG PO TABS: 10.0000 mg | ORAL_TABLET | ORAL | Status: DC | PRN

## 2019-04-29 MED ORDER — IBUPROFEN 600 MG PO TABS
600.0000 mg | ORAL_TABLET | Freq: Four times a day (QID) | ORAL | Status: DC
  Filled 2019-04-29 (×4): qty 1

## 2019-04-29 MED ORDER — ONDANSETRON HCL 4 MG/2ML IJ SOLN: 4.0000 mg | INTRAMUSCULAR | Status: DC | PRN

## 2019-04-29 MED ORDER — SIMETHICONE 80 MG PO CHEW: 80.0000 mg | CHEWABLE_TABLET | ORAL | Status: DC | PRN

## 2019-04-29 MED ORDER — METHYLERGONOVINE MALEATE 0.2 MG/ML IJ SOLN
INTRAMUSCULAR | Status: AC
  Filled 2019-04-29: qty 1

## 2019-04-29 NOTE — Progress Notes (Addendum)
Post Partum Day 1 Subjective: no complaints, up ad lib, voiding, tolerating PO. Denies nausea, vomiting, headache, vision changes, RUQ pain, bowel movement, and flatulence. Reports her bleeding is moderate.  Objective: Blood pressure 103/62, pulse 63, temperature 98.8 F (37.1 C), temperature source Oral, resp. rate 18, height 5\' 2"  (1.575 m), weight 63.4 kg, last menstrual period 07/22/2018, SpO2 100 %, unknown if currently breastfeeding.  Physical Exam:  General: alert, cooperative and in good spirits Lochia: appropriate Uterine Fundus: firm DVT Evaluation: No evidence of DVT seen on physical exam. No cords or calf tenderness. No significant calf/ankle edema.  Recent Labs    04/28/19 0752 04/29/19 0507  HGB 13.0 12.2  HCT 39.1 36.5    Assessment/Plan: Plan for discharge tomorrow if baby is able.  Feed: breast Contraception: undecided Needs a Rhogam-eval    LOS: 1 day   05/01/19, MS3 04/29/2019, 6:18 AM   GME ATTESTATION:  I saw and evaluated the patient. I agree with the findings and the plan of care as documented in the student's note.  05/01/2019, DO OB Fellow, Faculty [facility], Center for [facility] 04/29/2019 6:34 AM

## 2019-04-29 NOTE — Lactation Note (Signed)
This note was copied from a baby's chart. Lactation Consultation Note  Patient Name: Linda Hanson VELFY'B Date: 04/29/2019 Reason for consult: Initial assessment;Infant weight loss;Other (Comment)(P 3 , experienced BF x 2)  Baby is 16 hours old , per mom last fed at 1415 for 12 mins with swallows.  Mom mentioned with her 2nd baby she had terribly sore nipples that were difficult to  Heal and baby was checked and did not have a tongue tie.  Sea salt washes what finally healed the nipple soreness. The right was worse than the left nipple.  Mom holding baby asleep on the left breast breast , so mom showed LC the right,  Nipple flat  very tiny and areola also small too.  LC recommended prior to every feeding as a preventive measure moist heat , breast massage, hand express, pre - pump with hand pump ( either #21 or #24 F , both checked and both work well to accommodate the nipple and areola to stretch th base)  LC had mom compress the areola after prepumping and the nipple was more erect and the areola more compressible .  LC explained to mom the steps would be to prevent soreness and stretch the areola For a deeper latch and prime the milk ducts.  Mom receptive to [facility] recommendations.  LC provided the pamphlet for [facility] resources after delivery.    Maternal Data Does the patient have breastfeeding experience prior to this delivery?: Yes  Feeding Feeding Type: (baby has latched recently)  [facility] Score                   Interventions Interventions: Breast feeding basics reviewed;Shells;Hand pump  Lactation Tools Discussed/Used Tools: Shells;Pump;Flanges;Coconut oil(LC asked Jess Reynolds to provide coconut oil for mom - see LC note) Flange Size: 24;21 Shell Type: Inverted Breast pump type: Manual   Consult Status Consult Status: Follow-up Date: 04/29/19 Follow-up type: In-patient    Matilde Sprang Retia Cordle 04/29/2019, 3:41 PM

## 2019-04-30 ENCOUNTER — Inpatient Hospital Stay (HOSPITAL_COMMUNITY): Payer: Commercial Managed Care - PPO

## 2019-04-30 DIAGNOSIS — Z9889 Other specified postprocedural states: Secondary | ICD-10-CM

## 2019-04-30 MED ORDER — POLYETHYLENE GLYCOL 3350 17 GM/SCOOP PO POWD: 17.0000 g | Freq: Every day | ORAL | 1 refills | Status: DC | PRN

## 2019-04-30 MED ORDER — IBUPROFEN 600 MG PO TABS: 600.0000 mg | ORAL_TABLET | Freq: Four times a day (QID) | ORAL | 0 refills | Status: DC

## 2019-04-30 MED ORDER — ACETAMINOPHEN 325 MG PO TABS: 650.0000 mg | ORAL_TABLET | ORAL | 0 refills | Status: DC | PRN

## 2019-04-30 MED ORDER — PRENATAL + DHA 27-1 & 250 MG PO THPK: 1.0000 | PACK | Freq: Every day | ORAL | 11 refills | Status: DC

## 2019-04-30 NOTE — Lactation Note (Signed)
This note was copied from a baby's chart. Lactation Consultation Note  Patient Name: Linda Hanson RWERX'V Date: 04/30/2019 Reason for consult: Follow-up assessment;Term;Infant weight loss;Other (Comment)(8 % weight loss)  Baby is 34 hours old / 8 % weight loss/ Bili check - 3.2 at 31 hours  Per mom the abby last fed at 4 am and cluster fed last evening and night.  Mom mentioned the recommendation to prevent soreness has helped a lot.  Has been pre-pumping and breast are filling fuller.  Sore nipple and engorgement prevention and tx reviewed. Added to the sore nipple  Prevention  tx - comfort gels x 6 days.  LC assessed both nipples with mom permission , and the areolas more compressible than yesterday, upper portion of both nipples noted to have some  Bruising. LC stressed the use of the comfort gels and the shells and pre- pump with hand pump. Observed latch with depth and swallows, and showed mom how  to adjust the position. Baby still feeding .   Mom has the mother and baby care booklet  And [facility] pamphlet with phone numbers.      Maternal Data Has patient been taught Hand Expression?: Yes  Feeding Feeding Type: Breast Fed  LATCH Score Latch: Grasps breast easily, tongue down, lips flanged, rhythmical sucking.  Audible Swallowing: Spontaneous and intermittent  Type of Nipple: Everted at rest and after stimulation  Comfort (Breast/Nipple): Filling, red/small blisters or bruises, mild/mod discomfort(per mom)  Hold (Positioning): Assistance needed to correctly position infant at breast and maintain latch.  LATCH Score: 8  Interventions Interventions: Breast feeding basics reviewed;Adjust position  Lactation Tools Discussed/Used Tools: Shells;Pump;Flanges;Comfort gels Flange Size: 24;21 Shell Type: Inverted Breast pump type: Manual Pump Review: Milk Storage   Consult Status Consult Status: Complete Date: 04/30/19    Kathrin Greathouse 04/30/2019, 9:04  AM

## 2019-04-30 NOTE — Progress Notes (Signed)
Lower extremity venous has been completed.   Preliminary results in CV Proc.   Blanch Media 04/30/2019 11:53 AM

## 2019-04-30 NOTE — Progress Notes (Signed)
Pt. States she has noted 2 new "clots" left leg. Painful to the touch. Small hard area noted to left posterior lower leg and posterior thigh. Pedal pulses present and strong. No redness or swelling noted.   Tylene Fantasia, Rn

## 2019-04-30 NOTE — Progress Notes (Signed)
POSTPARTUM PROGRESS NOTE  Post Partum Day 2  Subjective:  Linda Hanson is a 39 y.o. [id number] s/p NSVD at [redacted]w[redacted]d.  She reports she is doing well. No acute events overnight. She denies any problems with ambulating, voiding or po intake. Denies nausea or vomiting.  Pain is well controlled.  Lochia is less than a period.  Endorses finding new swelling she is concerned is a new thrombophlebitis on the back of her L calf. Also has area of swelling in L perineum. Denies chest pain or SOB.   Objective: Blood pressure 105/73, pulse (!) 48, temperature 98.1 F (36.7 C), temperature source Oral, resp. rate 17, height 5\' 2"  (1.575 m), weight 63.4 kg, last menstrual period 07/22/2018, SpO2 100 %, unknown if currently breastfeeding.  Physical Exam:  General: alert, cooperative and no distress Chest: no respiratory distress Heart:regular rate, distal pulses intact Abdomen: soft, nontender,  Uterine Fundus: firm, appropriately tender DVT Evaluation: No calf swelling or tenderness Extremities: no LE pretibial edema. Palpable cord in superior posterior L calf and L perineum. Skin: warm, dry  Recent Labs    04/28/19 0752 04/29/19 0507  HGB 13.0 12.2  HCT 39.1 36.5    Assessment/Plan: Linda Hanson is a 39 y.o. 30 s/p NSVD at [redacted]w[redacted]d   PPD#2 - Doing well  Routine postpartum care Leg swelling: hx of superficial thrombophlebitis in January, new findings and high risk for DVT in perpartum period, repeat LLE duplex today. Contraception: Vasectomy. Discussed importance of backup plan until fully effective Feeding: Breast Dispo: Plan for discharge today if duplex rules out DVT.   LOS: 2 days   February, MD/MPH OB Fellow  04/30/2019, 8:07 AM

## 2019-06-11 ENCOUNTER — Other Ambulatory Visit: Payer: Self-pay

## 2019-06-11 ENCOUNTER — Encounter: Payer: Self-pay | Admitting: Family Medicine

## 2019-06-11 ENCOUNTER — Telehealth (HOSPITAL_BASED_OUTPATIENT_CLINIC_OR_DEPARTMENT_OTHER): Payer: Commercial Managed Care - PPO | Admitting: Family Medicine

## 2019-06-11 NOTE — Progress Notes (Signed)
     I connected with@ on 06/11/19 at 10:00 AM EDT by: Mychart video and verified that I am speaking with the correct person using two identifiers.  Patient is located at home and provider is located at [employer] for [employer] at [facility] .     The purpose of this virtual visit is to provide medical care while limiting exposure to the novel coronavirus. I discussed the limitations, risks, security and privacy concerns of performing an evaluation and management service by phone and the availability of in person appointments. I also discussed with the patient that there may be a patient responsible charge related to this service. By engaging in this virtual visit, you consent to the provision of healthcare.  Additionally, you authorize for your insurance to be billed for the services provided during this visit.  The patient expressed understanding and agreed to proceed.  The following staff members participated in the virtual visit:  Scheryl Marten RN  Post Partum Visit Note Subjective:   Jhada Risk is a 39 y.o. [id number] female being evaluated for postpartum followup.  She is 6 weeks postpartum following a normal spontaneous vaginal delivery at  40.0 gestational weeks.  I have fully reviewed the prenatal and intrapartum course; pregnancy complicated by .  Postpartum course has been uncomplicated . Baby is doing well. Baby is feeding by breast. Bleeding no bleeding. Bowel function is normal. Bladder function is normal. Patient is not sexually active. Contraception method is husband is thinking about a vasectomy.. Postpartum depression screening: negative.  The following portions of the patient's history were reviewed and updated as appropriate: allergies, current medications, past family history, past medical history, past social history, past surgical history and problem list.  Review of Systems Pertinent items noted in HPI and remainder of comprehensive ROS otherwise negative.    Objective:   Vitals:   06/11/19 0955  BP: (!) 109/51  Pulse: 76   Self-Obtained       General:  Alert, oriented and cooperative. Patient appears to be in no acute distress.  Mental Status: Normal mood and affect. Normal behavior. Normal judgment and thought content.   Respiratory: Normal respiratory effort, no problems with respiration noted  Rest of physical exam deferred due to type of encounter Assessment:    Normal postpartum exam.  Plan:  Essential components of care per ACOG recommendations:  1.  Mood and well being: Patient with negative depression screening today. Reviewed local resources for support.   2. Infant care and feeding:  -Patient currently breastmilk feeding? Yes   3. Sexuality, contraception and birth spacing - Patient does not want a pregnancy in the next year.  Desired family size is 3 children.  - Reviewed forms of contraception in tiered fashion. Patient desired vasectomy today.   - Discussed birth spacing of 18 months  4. Sleep and fatigue -Encouraged family/partner/community support of 4 hrs of uninterrupted sleep to help with mood and fatigue  5. Physical Recovery  - Discussed patients delivery and complications - Patient has urinary incontinence? No - Patient is safe to resume physical and sexual activity  6.  Health Maintenance - Last pap smear done 09/2018 and was normal with positive HPV.   17 minutes of face-to-face time spent with the patient    Reva Bores, MD Center for [facility], [facility]

## 2019-06-26 ENCOUNTER — Encounter: Payer: Self-pay | Admitting: Radiology

## 2019-10-10 ENCOUNTER — Ambulatory Visit: Payer: Commercial Managed Care - PPO | Admitting: Family Medicine

## 2019-11-13 ENCOUNTER — Other Ambulatory Visit (HOSPITAL_COMMUNITY)
Admission: RE | Admit: 2019-11-13 | Discharge: 2019-11-13 | Disposition: A | Discharge status: Home / Self Care | Payer: Commercial Managed Care - PPO | Source: Ambulatory Visit | Attending: Family Medicine | Admitting: Family Medicine

## 2019-11-13 ENCOUNTER — Encounter: Payer: Self-pay | Admitting: Family Medicine

## 2019-11-13 ENCOUNTER — Other Ambulatory Visit: Payer: Self-pay

## 2019-11-13 ENCOUNTER — Ambulatory Visit (INDEPENDENT_AMBULATORY_CARE_PROVIDER_SITE_OTHER): Payer: Commercial Managed Care - PPO | Admitting: Family Medicine

## 2019-11-13 VITALS — BP 110/75 | HR 73 | Ht 62.0 in | Wt 110.6 lb

## 2019-11-13 DIAGNOSIS — R87810 Cervical high risk human papillomavirus (HPV) DNA test positive: Secondary | ICD-10-CM

## 2019-11-13 NOTE — Progress Notes (Signed)
   GYNECOLOGY PROBLEM  VISIT ENCOUNTER NOTE  Subjective:   Linda Hanson is a 39 y.o. [phone] female here for a a problem GYN visit.  Current complaints: needs pap.  Currently exclusively breastfeeding 19 month old.  Denies abnormal vaginal bleeding, discharge, pelvic pain, problems with intercourse or other gynecologic concerns.    Gynecologic History No LMP recorded. (Menstrual status: Lactating). Contraception: none  Health Maintenance Due  Topic Date Due  . Hepatitis C Screening  Never done  . INFLUENZA VACCINE  Never done     The following portions of the patient's history were reviewed and updated as appropriate: allergies, current medications, past family history, past medical history, past social history, past surgical history and problem list.  Review of Systems Pertinent items are noted in HPI.   Objective:  BP 110/75   Pulse 73   Ht 5\' 2"  (1.575 m)   Wt 110 lb 9.6 oz (50.2 kg)   Breastfeeding Yes   BMI 20.23 kg/m  Gen: well appearing, NAD HEENT: no scleral icterus CV: RR Lung: Normal WOB Ext: warm well perfused  PELVIC: Normal appearing external genitalia; normal appearing vaginal mucosa and cervix.  No abnormal discharge noted.  Pap smear obtained.  Normal uterine size, no other palpable masses, no uterine or adnexal tenderness.   Assessment and Plan:  1. Cervical high risk HPV (human papillomavirus) test positive - Cytology - PAP   Please refer to After Visit Summary for other counseling recommendations.   No follow-ups on file.  , MD, MPH, ABFM Attending Physician Faculty Practice- Center for [facility]

## 2019-11-18 LAB — CYTOLOGY - PAP
Comment: NEGATIVE
Comment: NEGATIVE
Diagnosis: UNDETERMINED — AB
HPV 16: NEGATIVE
HPV 18 / 45: NEGATIVE
High risk HPV: POSITIVE — AB

## 2019-12-11 ENCOUNTER — Encounter: Payer: Self-pay | Admitting: Family Medicine

## 2019-12-11 ENCOUNTER — Other Ambulatory Visit: Payer: Self-pay | Admitting: Family Medicine

## 2019-12-11 ENCOUNTER — Ambulatory Visit (INDEPENDENT_AMBULATORY_CARE_PROVIDER_SITE_OTHER): Payer: Commercial Managed Care - PPO | Admitting: Family Medicine

## 2019-12-11 ENCOUNTER — Other Ambulatory Visit: Payer: Self-pay

## 2019-12-11 VITALS — BP 111/72 | HR 81 | Ht 62.0 in | Wt 110.2 lb

## 2019-12-11 DIAGNOSIS — R87810 Cervical high risk human papillomavirus (HPV) DNA test positive: Secondary | ICD-10-CM

## 2019-12-11 DIAGNOSIS — R87610 Atypical squamous cells of undetermined significance on cytologic smear of cervix (ASC-US): Secondary | ICD-10-CM

## 2019-12-11 LAB — POCT URINE PREGNANCY: Preg Test, Ur: NEGATIVE

## 2019-12-11 NOTE — Progress Notes (Signed)
    GYNECOLOGY OFFICE COLPOSCOPY PROCEDURE NOTE  39 y.o. [id number] here for colposcopy for ASCUS with POSITIVE high risk HPV pap smear on 11/13/2019. Discussed role for HPV in cervical dysplasia, need for surveillance.  Patient gave informed written consent, time out was performed.  Placed in lithotomy position. Cervix viewed with speculum and colposcope after application of acetic acid.   Colposcopy adequate? Yes  visible lesion(s) at 11 o'clock- dense acetowhite change, sampled almost in entirety. Hazy acetowhite changes circumferentially around cervical os; Representative bx take at 2 oclock corresponding biopsies obtained.  ECC specimen obtained. All specimens were labeled and sent to pathology.  Patient was given post procedure instructions.  Will follow up pathology and manage accordingly; patient will be contacted with results and recommendations.  Routine preventative health maintenance measures emphasized.   Federico Flake, MD, MPH, ABFM, [facility] Attending Physician Center for [facility]

## 2020-08-08 IMAGING — US US EXTREM LOW VENOUS*L*
1 series · 13 of 24 positions shown · non-contrast
Comparison: None.

CLINICAL DATA: Left leg pain and swelling for several days



[Series 1: us extrem low venous*left* · 0.08mm/px · 13 of 40 slices shown]
[im 1/40]
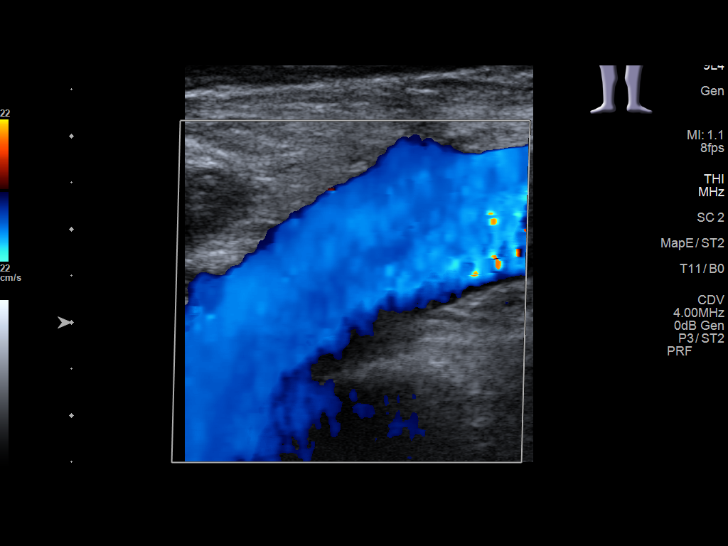
[im 4/40]
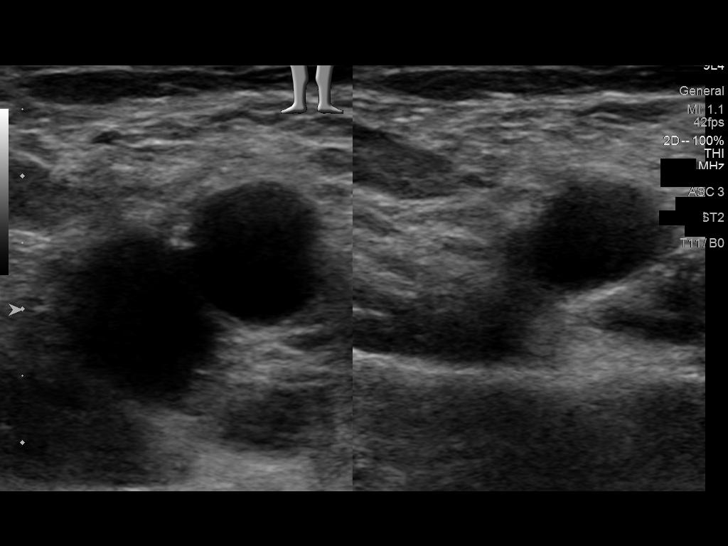
[im 7/40]
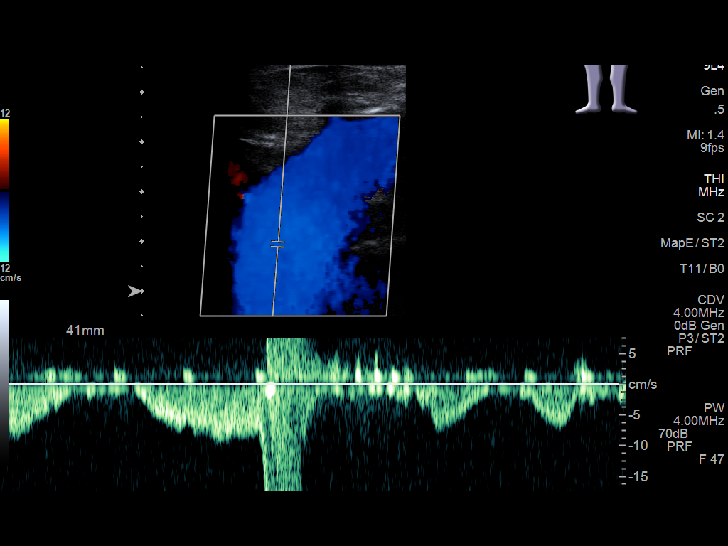
[im 11/40]
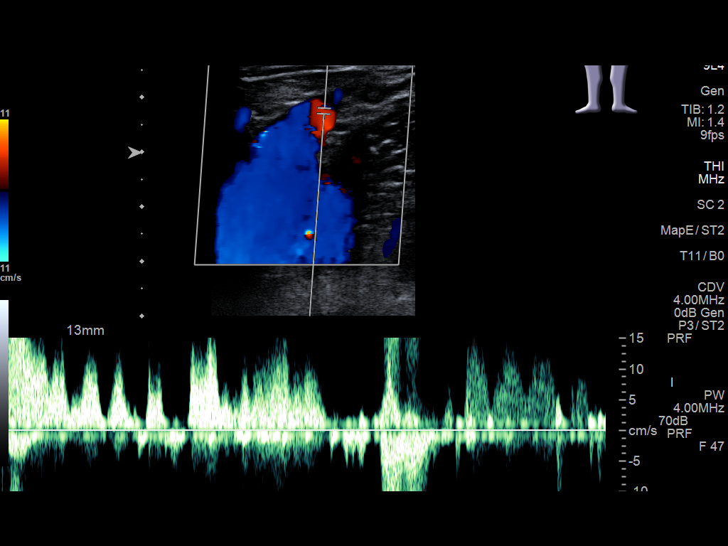
[im 14/40]
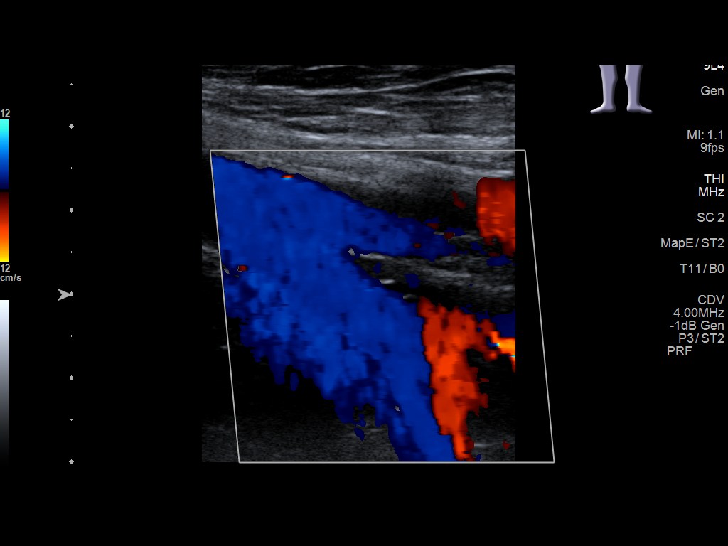
[im 17/40]
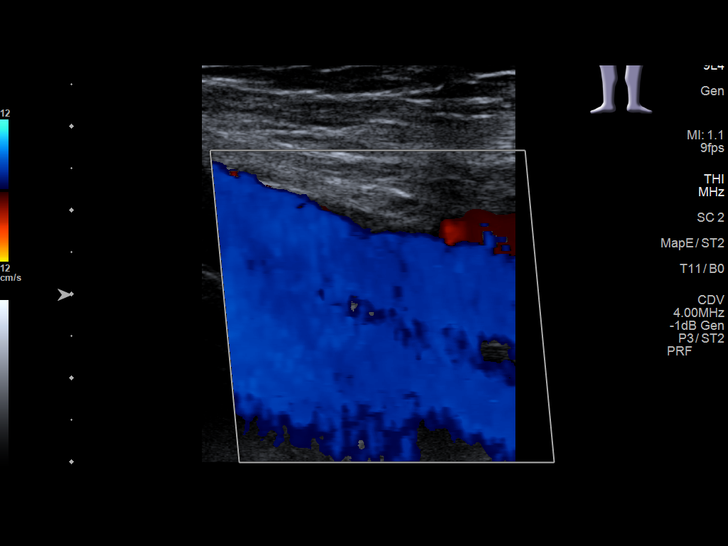
[im 21/40]
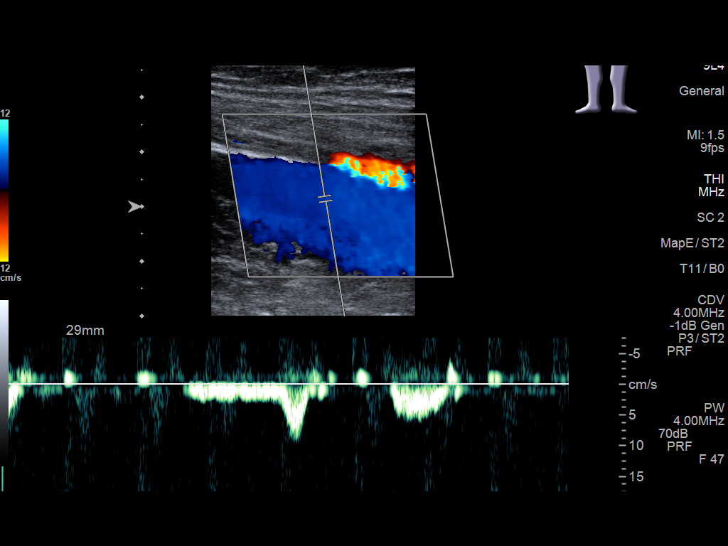
[im 23/40]
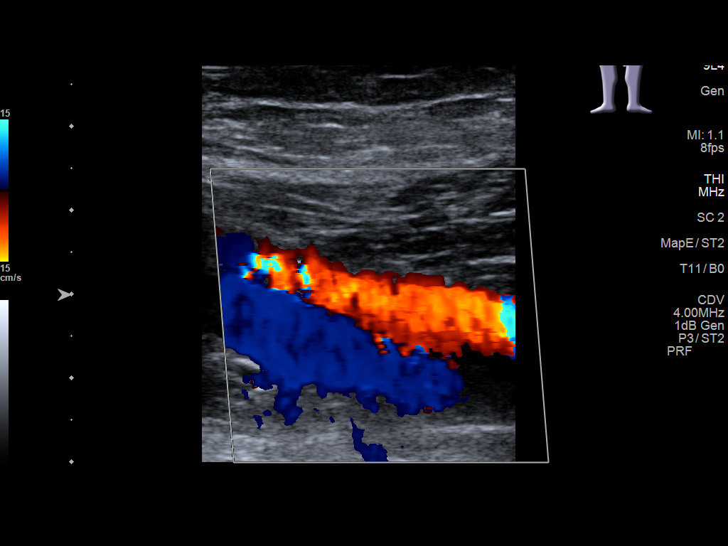
[im 26/40]
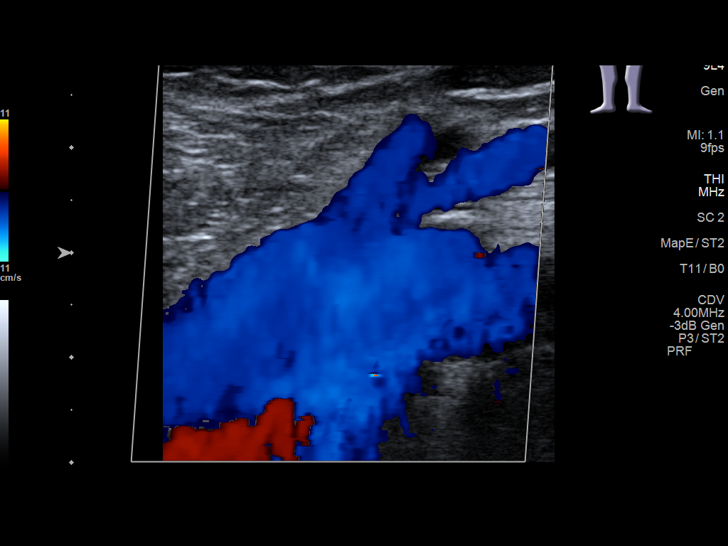
[im 29/40]
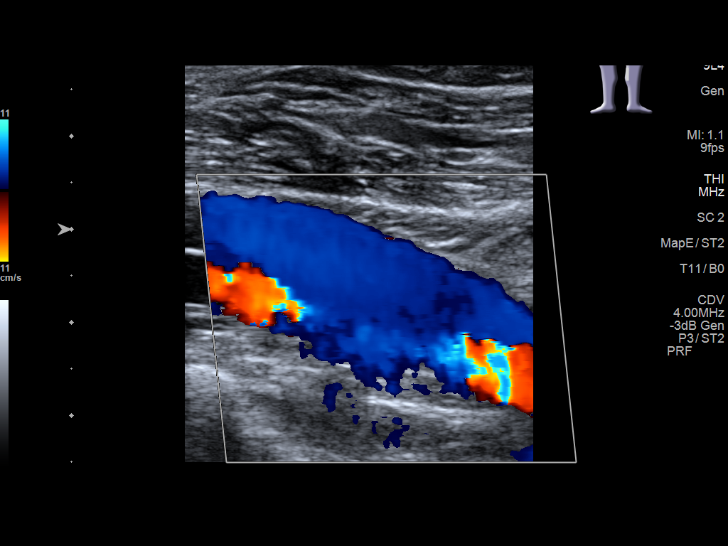
[im 33/40]
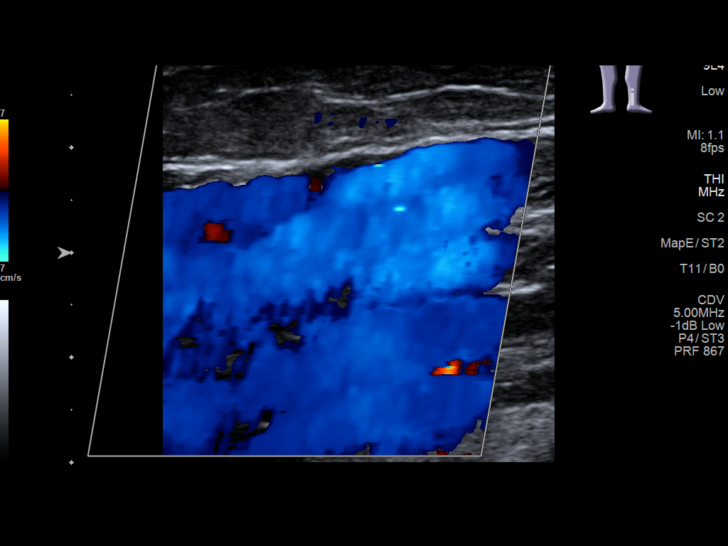
[im 36/40]
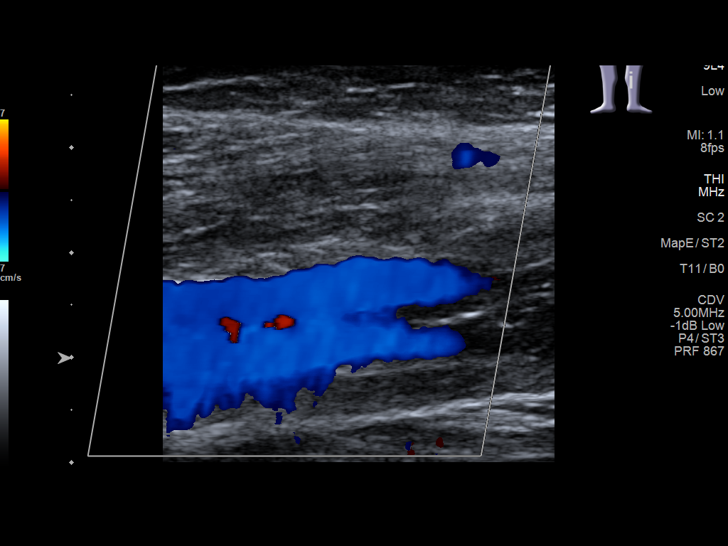
[im 40/40]
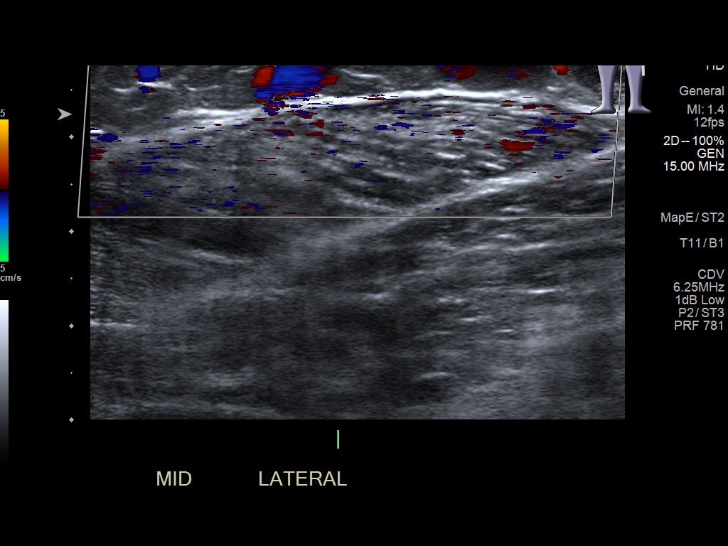

[13 of 24 positions shown; findings below may reference images not displayed]

FINDINGS: Contralateral Common Femoral Vein: Respiratory phasicity is normal
and symmetric with the symptomatic side. No evidence of thrombus.
Normal compressibility.

Common Femoral Vein: No evidence of thrombus. Normal
compressibility, respiratory phasicity and response to augmentation.

Saphenofemoral Junction: No evidence of thrombus. Normal
compressibility and flow on color Doppler imaging.

Profunda Femoral Vein: No evidence of thrombus. Normal
compressibility and flow on color Doppler imaging.

Femoral Vein: No evidence of thrombus. Normal compressibility,
respiratory phasicity and response to augmentation.

Popliteal Vein: No evidence of thrombus. Normal compressibility,
respiratory phasicity and response to augmentation.

Calf Veins: No evidence of thrombus. Normal compressibility and flow
on color Doppler imaging.

Superficial Great Saphenous Vein: No evidence of thrombus. Normal
compressibility.

Venous Reflux:  None.

Other Findings: There are changes consistent with superficial
thrombophlebitis in the lateral aspect of the calf. Multiple
varicosities are noted in this region as well.
IMPRESSION: Superficial thrombophlebitis within multiple lateral calf
varicosities.

No deep venous thrombosis is noted.

## 2020-10-23 ENCOUNTER — Encounter: Payer: Self-pay | Admitting: Radiology

## 2020-10-29 ENCOUNTER — Other Ambulatory Visit: Payer: Self-pay

## 2020-10-29 ENCOUNTER — Encounter: Payer: Self-pay | Admitting: Family Medicine

## 2020-10-29 ENCOUNTER — Ambulatory Visit (INDEPENDENT_AMBULATORY_CARE_PROVIDER_SITE_OTHER): Payer: Commercial Managed Care - PPO | Admitting: Family Medicine

## 2020-10-29 VITALS — BP 100/70 | HR 67 | Temp 96.1°F | Ht 61.75 in | Wt 106.5 lb

## 2020-10-29 DIAGNOSIS — Z0000 Encounter for general adult medical examination without abnormal findings: Secondary | ICD-10-CM

## 2020-10-29 DIAGNOSIS — R21 Rash and other nonspecific skin eruption: Secondary | ICD-10-CM

## 2020-10-29 DIAGNOSIS — Z1159 Encounter for screening for other viral diseases: Secondary | ICD-10-CM | POA: Diagnosis not present

## 2020-10-29 LAB — COMPREHENSIVE METABOLIC PANEL
ALT: 12 U/L (ref 0–35)
AST: 16 U/L (ref 0–37)
Albumin: 4.8 g/dL (ref 3.5–5.2)
Alkaline Phosphatase: 29 U/L — ABNORMAL LOW (ref 39–117)
BUN: 21 mg/dL (ref 6–23)
CO2: 27 mEq/L (ref 19–32)
Calcium: 9.6 mg/dL (ref 8.4–10.5)
Chloride: 103 mEq/L (ref 96–112)
Creatinine, Ser: 0.82 mg/dL (ref 0.40–1.20)
GFR: 90.47 mL/min (ref >60.00)
Glucose, Bld: 82 mg/dL (ref 70–99)
Potassium: 4.3 mEq/L (ref 3.5–5.1)
Sodium: 138 mEq/L (ref 135–145)
Total Bilirubin: 0.5 mg/dL (ref 0.2–1.2)
Total Protein: 7.2 g/dL (ref 6.0–8.3)

## 2020-10-29 LAB — LIPID PANEL
Cholesterol: 163 mg/dL (ref 0–200)
HDL: 80.7 mg/dL (ref >39.00)
LDL Cholesterol: 71 mg/dL (ref 0–99)
NonHDL: 82.38
Total CHOL/HDL Ratio: 2
Triglycerides: 57 mg/dL (ref 0.0–149.0)
VLDL: 11.4 mg/dL (ref 0.0–40.0)

## 2020-10-29 MED ORDER — TRIAMCINOLONE ACETONIDE 0.1 % EX CREA: 1.0000 "application " | TOPICAL_CREAM | Freq: Two times a day (BID) | CUTANEOUS | 0 refills | Status: DC

## 2020-10-29 NOTE — Assessment & Plan Note (Signed)
Given hx of psoriasis reasonable to use steroids to see if this improves symptoms. Call if not improving

## 2020-10-29 NOTE — Progress Notes (Signed)
Annual Exam   Chief Complaint:  Chief Complaint  Patient presents with   Annual Exam    Declines flu shot   Skin Problem    Dry/cracked area On R hand x 2-3 months. Has hx of psoriasis     History of Present Illness:  Ms. Linda Hanson is a 40 y.o. [phone] who LMP was No LMP recorded. (Menstrual status: Lactating)., presents today for her annual examination.    # Dry skin - hx of psoriasis - worse with any water - for a few months on the right palm - usually just gets a small patch - typically a topical steroid for 1-2 days and it improves - when she young was prescribed betamethasone - daughter has some expired topical steroid which she has not used - cracking skin - not itchy - is good about using moisturizer daily - and hands at night  Nutrition/Lifestyle Diet: healthy, more veggies Exercise: beach body 6 days a week She does get adequate calcium and Vitamin D in her diet.  Social History   Tobacco Use  Smoking Status Never  Smokeless Tobacco Never   Social History   Substance and Sexual Activity  Alcohol Use Not Currently   Alcohol/week: 1.0 standard drink   Types: 1 Cans of beer per week   Social History   Substance and Sexual Activity  Drug Use Never     Safety The patient wears seatbelts: yes.     The patient feels safe at home and in their relationships: yes.  General Health Dentist in the last year: Yes Eye doctor: yes  Menstrual Her menses are light, just started getting them is still breast feeding  GYN She is single partner, contraception - none.     Cervical Cancer Screening (Age 70-65) Last Pap:  November 2021 Results were: atypical squamous cellularity of undetermined significance (ASCUS) with HPV positive  Family History of Breast Cancer: no Family History of Ovarian Cancer: no    Weight Wt Readings from Last 3 Encounters:  10/29/20 106 lb 8 oz (48.3 kg)  12/11/19 110 lb 3.2 oz (50 kg)  11/13/19 110 lb 9.6 oz (50.2 kg)    Patient has normal BMI  BMI Readings from Last 1 Encounters:  10/29/20 19.64 kg/m     Chronic disease screening Blood pressure monitoring:  BP Readings from Last 3 Encounters:  10/29/20 100/70  12/11/19 111/72  11/13/19 110/75     Lipid Monitoring: Indication for screening: age >52, obesity, diabetes, family hx, CV risk factors.  Lipid screening: Yes  Lab Results  Component Value Date   CHOL 166 11/09/2018   HDL 79.50 11/09/2018   LDLCALC 74 11/09/2018   TRIG 64.0 11/09/2018   CHOLHDL 2 11/09/2018     Diabetes Screening: age >25, overweight, family hx, PCOS, hx of gestational diabetes, at risk ethnicity, elevated blood pressure >135/80.  Diabetes Screening screening: Yes  No results found for: HGBA1C    Past Medical History:  Diagnosis Date   Vaginal Pap smear, abnormal    hpv    History reviewed. No pertinent surgical history.  Prior to Admission medications   Medication Sig Start Date End Date Taking? Authorizing Provider  Prenatal-FeFum-FA-DHA w/o A (PRENATAL + DHA) 27-1 & 250 MG THPK Take 1 tablet by mouth daily. 04/30/19  Yes Arvilla Market, MD    Allergies  Allergen Reactions   Penicillin G Hives    Gynecologic History: No LMP recorded. (Menstrual status: Lactating).  Obstetric History: [id number]  Social History  Socioeconomic History   Marital status: Married    Spouse name: Linda Hanson   Number of children: 2   Years of education: Master's    Highest education level: Not on file  Occupational History   Occupation: Water quality scientist  Tobacco Use   Smoking status: Never   Smokeless tobacco: Never  Vaping Use   Vaping Use: Never used  Substance and Sexual Activity   Alcohol use: Not Currently    Alcohol/week: 1.0 standard drink    Types: 1 Cans of beer per week   Drug use: Never   Sexual activity: Yes    Comment: pregnant  Other Topics Concern   Not on file  Social History Narrative   11/08/18   From: [place] - moved  to [place] from [place] due to [employer] job   Living: with husband and 2 kids   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      Family: Husband, Linda Hanson, 2 kids Linda Hanson (2015) and Linda Hanson (2018)      Enjoys: exercise, hiking, playing with children      Exercise: regular   Diet: pregnancy diet      Safety   Seat belts: Yes    Guns: No   Safe in relationships: Yes    Social Determinants of Corporate investment banker Strain: Not on file  Food Insecurity: Not on file  Transportation Needs: Not on file  Physical Activity: Not on file  Stress: Not on file  Social Connections: Not on file  Intimate Partner Violence: Not on file    Family History  Problem Relation Age of Onset   Diabetes Mother    Arthritis Father    Hyperlipidemia Father    Hypertension Father    Stroke Father 47   Learning disabilities Sister        reading   Heart disease Maternal Grandmother    Hyperlipidemia Maternal Grandmother    Heart attack Maternal Grandmother 64   Alzheimer's disease Maternal Grandfather    Alzheimer's disease Paternal Grandmother    Arthritis Paternal Grandfather    Prostate cancer Paternal Grandfather     Review of Systems  Constitutional:  Negative for chills and fever.  HENT:  Negative for congestion and sore throat.   Eyes:  Negative for blurred vision and double vision.  Respiratory:  Negative for shortness of breath.   Cardiovascular:  Negative for chest pain.  Gastrointestinal:  Negative for heartburn, nausea and vomiting.  Genitourinary: Negative.   Musculoskeletal: Negative.  Negative for myalgias.  Skin:  Positive for rash.  Neurological:  Negative for dizziness and headaches.  Endo/Heme/Allergies:  Does not bruise/bleed easily.  Psychiatric/Behavioral:  Negative for depression. The patient is not nervous/anxious.     Physical Exam BP 100/70   Pulse 67   Temp (!) 96.1 F (35.6 C) (Temporal)   Ht 5' 1.75" (1.568 m)   Wt 106 lb 8 oz (48.3 kg)   SpO2 99%    Breastfeeding Yes   BMI 19.64 kg/m    BP Readings from Last 3 Encounters:  10/29/20 100/70  12/11/19 111/72  11/13/19 110/75    Wt Readings from Last 3 Encounters:  10/29/20 106 lb 8 oz (48.3 kg)  12/11/19 110 lb 3.2 oz (50 kg)  11/13/19 110 lb 9.6 oz (50.2 kg)     Physical Exam Constitutional:      General: She is not in acute distress.    Appearance: She is well-developed. She is not diaphoretic.  HENT:  Head: Normocephalic and atraumatic.     Right Ear: External ear normal.     Left Ear: External ear normal.     Nose: Nose normal.  Eyes:     General: No scleral icterus.    Extraocular Movements: Extraocular movements intact.     Conjunctiva/sclera: Conjunctivae normal.  Cardiovascular:     Rate and Rhythm: Normal rate and regular rhythm.     Heart sounds: No murmur heard. Pulmonary:     Effort: Pulmonary effort is normal. No respiratory distress.     Breath sounds: Normal breath sounds. No wheezing.  Abdominal:     General: Bowel sounds are normal. There is no distension.     Palpations: Abdomen is soft. There is no mass.     Tenderness: There is no abdominal tenderness. There is no guarding or rebound.  Musculoskeletal:        General: Normal range of motion.     Cervical back: Neck supple.  Lymphadenopathy:     Cervical: No cervical adenopathy.  Skin:    General: Skin is warm and dry.     Capillary Refill: Capillary refill takes less than 2 seconds.     Comments: Right palm - skin breakdown and faint erythema   Neurological:     Mental Status: She is alert and oriented to person, place, and time.     Deep Tendon Reflexes: Reflexes normal.  Psychiatric:        Mood and Affect: Mood normal.        Behavior: Behavior normal.      Results: Depression screen [facility] 2/9 10/29/2020 11/08/2018  Decreased Interest 0 0  Down, Depressed, Hopeless 0 0  PHQ - 2 Score 0 0      Assessment: 40 y.o. [id number] female here for routine annual  examination.  Plan: Problem List Items Addressed This Visit       Musculoskeletal and Integument   Rash of hand    Given hx of psoriasis reasonable to use steroids to see if this improves symptoms. Call if not improving      Relevant Medications   triamcinolone cream (KENALOG) 0.1 %   Other Visit Diagnoses     Annual physical exam    -  Primary   Relevant Orders   Lipid panel   Comprehensive metabolic panel   Need for hepatitis C screening test       Relevant Orders   Hepatitis C antibody        Screening: -- Blood pressure screen normal -- cholesterol screening: will obtain -- Weight screening: normal -- Diabetes Screening: will obtain -- Nutrition: encouraged healthy diet   Psych -- Depression screening (PHQ-9): negative   Safety -- tobacco screening: not using -- alcohol screening:  low-risk usage. -- no evidence of domestic violence or intimate partner violence.   Cancer Screening -- pap smear not collected per ASCCP guidelines -- family history of breast cancer screening: done. not at high risk.   Immunizations Immunization History  Administered Date(s) Administered   Tdap 05/11/2016, 02/07/2019    -- flu vaccine not up to date - pt declined -- TDAP q10 years up to date -- Covid-19 Vaccine not up to date - has not gotten the new  Encouraged regular vision and dental screening. Encouraged healthy exercise and diet.   Linda Hanson

## 2020-10-29 NOTE — Patient Instructions (Signed)
Preventive Care 21-39 Years Old, Female Preventive care refers to lifestyle choices and visits with your health care provider that can promote health and wellness. This includes: A yearly physical exam. This is also called an annual wellness visit. Regular dental and eye exams. Immunizations. Screening for certain conditions. Healthy lifestyle choices, such as: Eating a healthy diet. Getting regular exercise. Not using drugs or products that contain nicotine and tobacco. Limiting alcohol use. What can I expect for my preventive care visit? Physical exam Your health care provider may check your: Height and weight. These may be used to calculate your BMI (body mass index). BMI is a measurement that tells if you are at a healthy weight. Heart rate and blood pressure. Body temperature. Skin for abnormal spots. Counseling Your health care provider may ask you questions about your: Past medical problems. Family's medical history. Alcohol, tobacco, and drug use. Emotional well-being. Home life and relationship well-being. Sexual activity. Diet, exercise, and sleep habits. Work and work environment. Access to firearms. Method of birth control. Menstrual cycle. Pregnancy history. What immunizations do I need? Vaccines are usually given at various ages, according to a schedule. Your health care provider will recommend vaccines for you based on your age, medical history, and lifestyle or other factors, such as travel or where you work. What tests do I need? Blood tests Lipid and cholesterol levels. These may be checked every 5 years starting at age 20. Hepatitis C test. Hepatitis B test. Screening Diabetes screening. This is done by checking your blood sugar (glucose) after you have not eaten for a while (fasting). STD (sexually transmitted disease) testing, if you are at risk. BRCA-related cancer screening. This may be done if you have a family history of breast, ovarian, tubal, or  peritoneal cancers. Pelvic exam and Pap test. This may be done every 3 years starting at age 21. Starting at age 30, this may be done every 5 years if you have a Pap test in combination with an HPV test. Talk with your health care provider about your test results, treatment options, and if necessary, the need for more tests. Follow these instructions at home: Eating and drinking  Eat a healthy diet that includes fresh fruits and vegetables, whole grains, lean protein, and low-fat dairy products. Take vitamin and mineral supplements as recommended by your health care provider. Do not drink alcohol if: Your health care provider tells you not to drink. You are pregnant, may be pregnant, or are planning to become pregnant. If you drink alcohol: Limit how much you have to 0-1 drink a day. Be aware of how much alcohol is in your drink. In the [place] one drink equals one 12 oz bottle of beer (355 mL), one 5 oz glass of wine (148 mL), or one 1 oz glass of hard liquor (44 mL). Lifestyle Take daily care of your teeth and gums. Brush your teeth every morning and night with fluoride toothpaste. Floss one time each day. Stay active. Exercise for at least 30 minutes 5 or more days each week. Do not use any products that contain nicotine or tobacco, such as cigarettes, e-cigarettes, and chewing tobacco. If you need help quitting, ask your health care provider. Do not use drugs. If you are sexually active, practice safe sex. Use a condom or other form of protection to prevent STIs (sexually transmitted infections). If you do not wish to become pregnant, use a form of birth control. If you plan to become pregnant, see your health care provider   for a prepregnancy visit. Find healthy ways to cope with stress, such as: Meditation, yoga, or listening to music. Journaling. Talking to a trusted person. Spending time with friends and family. Safety Always wear your seat belt while driving or riding in a  vehicle. Do not drive: If you have been drinking alcohol. Do not ride with someone who has been drinking. When you are tired or distracted. While texting. Wear a helmet and other protective equipment during sports activities. If you have firearms in your house, make sure you follow all gun safety procedures. Seek help if you have been physically or sexually abused. What's next? Go to your health care provider once a year for an annual wellness visit. Ask your health care provider how often you should have your eyes and teeth checked. Stay up to date on all vaccines. This information is not intended to replace advice given to you by your health care provider. Make sure you discuss any questions you have with your health care provider. Document Revised: 03/06/2020 Document Reviewed: 09/07/2017 Elsevier Patient Education  2022 [employer]vier Inc.  

## 2020-10-30 LAB — HEPATITIS C ANTIBODY
Hepatitis C Ab: NONREACTIVE
SIGNAL TO CUT-OFF: 0.02 (ref <1.00)

## 2020-12-28 ENCOUNTER — Ambulatory Visit: Payer: Commercial Managed Care - PPO | Admitting: Family Medicine

## 2021-01-25 ENCOUNTER — Encounter: Payer: Self-pay | Admitting: Family Medicine

## 2021-01-25 ENCOUNTER — Other Ambulatory Visit: Payer: Self-pay

## 2021-01-25 ENCOUNTER — Other Ambulatory Visit (HOSPITAL_COMMUNITY)
Admission: RE | Admit: 2021-01-25 | Discharge: 2021-01-25 | Disposition: A | Discharge status: Home / Self Care | Payer: Commercial Managed Care - PPO | Source: Ambulatory Visit | Attending: Family Medicine | Admitting: Family Medicine

## 2021-01-25 ENCOUNTER — Ambulatory Visit (INDEPENDENT_AMBULATORY_CARE_PROVIDER_SITE_OTHER): Payer: Commercial Managed Care - PPO | Admitting: Family Medicine

## 2021-01-25 VITALS — BP 106/65 | HR 68 | Wt 108.0 lb

## 2021-01-25 DIAGNOSIS — R87810 Cervical high risk human papillomavirus (HPV) DNA test positive: Secondary | ICD-10-CM | POA: Diagnosis not present

## 2021-01-25 DIAGNOSIS — Z01419 Encounter for gynecological examination (general) (routine) without abnormal findings: Secondary | ICD-10-CM | POA: Diagnosis not present

## 2021-01-25 NOTE — Progress Notes (Signed)
Did get gardasil vaccine through [facility], received all 3 injections

## 2021-02-01 ENCOUNTER — Encounter: Payer: Self-pay | Admitting: Family Medicine

## 2021-02-01 LAB — CYTOLOGY - PAP
Comment: NEGATIVE
Diagnosis: UNDETERMINED — AB
High risk HPV: NEGATIVE

## 2021-02-01 NOTE — Progress Notes (Signed)
° °  GYNECOLOGY ANNUAL PREVENTATIVE CARE ENCOUNTER NOTE  Subjective:   Linda Hanson is a 41 y.o. [phone] female here for a routine annual gynecologic exam.  Current complaints: none, is breastfeeding toddler about 40 yo now!     Menses are regular periods every month days  Denies abnormal vaginal bleeding, discharge, pelvic pain, problems with intercourse or other gynecologic concerns.    Gynecologic History Patient's last menstrual period was 01/11/2021 (approximate). Contraception: Vasectomy and withdrawl Last Pap: ASCUS HPV pos (16/18/45 neg) in  2021 Last mammogram: NA  Health Maintenance Due  Topic Date Due   INFLUENZA VACCINE  Never done    The following portions of the patient's history were reviewed and updated as appropriate: allergies, current medications, past family history, past medical history, past social history, past surgical history and problem list.  Review of Systems Pertinent items are noted in HPI.   Objective:  BP 106/65    Pulse 68    Wt 108 lb (49 kg)    LMP 01/11/2021 (Approximate)    Breastfeeding Yes    BMI 19.91 kg/m  CONSTITUTIONAL: Well-developed, well-nourished female in no acute distress.  HENT:  Normocephalic, atraumatic, External right and left ear normal. Oropharynx is clear and moist EYES:  No scleral icterus.  NECK: Normal range of motion, supple, no masses.  Normal thyroid.  SKIN: Skin is warm and dry. No rash noted. Not diaphoretic. No erythema. No pallor. NEUROLOGIC: Alert and oriented to person, place, and time. Normal reflexes, muscle tone coordination. No cranial nerve deficit noted. PSYCHIATRIC: Normal mood and affect. Normal behavior. Normal judgment and thought content. CARDIOVASCULAR: Normal heart rate noted, regular rhythm. 2+ distal pulses. RESPIRATORY: Effort and breath sounds normal, no problems with respiration noted. BREASTS: Symmetric in size. No masses, skin changes, nipple drainage, or lymphadenopathy. ABDOMEN: Soft,  no  distention noted.  No tenderness, rebound or guarding.  PELVIC: Normal appearing external genitalia; normal appearing vaginal mucosa and cervix.  No abnormal discharge noted.  Pap smear obtained.  Normal uterine size, no other palpable masses, no uterine or adnexal tenderness. MUSCULOSKELETAL: Normal range of motion.    Assessment and Plan:  1) Annual gynecologic examination with pap smear:  Will follow up results of pap smear and manage accordingly. STI screening desired No.  Routine preventative health maintenance measures emphasized. Reviewed perimenopausal symptoms and management.    1. Cervical high risk HPV (human papillomavirus) test positive - ASCUS HPV pos, COLPO was CIN1 - Cytology - PAP   Please refer to After Visit Summary for other counseling recommendations.   No follow-ups on file.  Federico Flake, MD, MPH, ABFM Attending [facility] for [facility]

## 2021-11-05 ENCOUNTER — Ambulatory Visit (INDEPENDENT_AMBULATORY_CARE_PROVIDER_SITE_OTHER): Payer: Commercial Managed Care - PPO | Admitting: Nurse Practitioner

## 2021-11-05 ENCOUNTER — Encounter: Payer: Self-pay | Admitting: Nurse Practitioner

## 2021-11-05 VITALS — BP 96/60 | HR 63 | Temp 96.8°F | Resp 12 | Ht 61.0 in | Wt 112.1 lb

## 2021-11-05 DIAGNOSIS — Z0000 Encounter for general adult medical examination without abnormal findings: Secondary | ICD-10-CM | POA: Insufficient documentation

## 2021-11-05 NOTE — Patient Instructions (Addendum)
Nice to see you today I will be in touch with the lab results once I have them Follow up with me in 1 year for your next physical, sooner if you need me  Make a fasting lab appointment within the next two weeks

## 2021-11-05 NOTE — Assessment & Plan Note (Signed)
Discussed age-appropriate immunizations and screening exams.  Patient was given booklet information at dismissal of chronic that has preventative healthcare maintenance for her age range inclusive of anticipatory guidance.  Patient had a form to be filled out for insurance purposes.  Pending labs we will fill form out and contact patient.

## 2021-11-05 NOTE — Progress Notes (Signed)
Established Patient Office Visit  Subjective   Patient ID: Linda Hanson, female    DOB: [birth year]  Age: 40 y.o. MRN: [id number]  Chief Complaint  Patient presents with   Annual Exam    Needs form filled out for work    Transitions Of Care    HPI  for complete physical and follow up of chronic conditions.  Immunizations: -Tetanus:2021 -Influenza: refused -Shingles: Too young -Pneumonia: Too young  -HPV: States she did not get them growing up but has done Gardasil.  Diet: Fair diet. 2 full meals and grazing the rest of the time. Coffee in the am and then water the rest of the day. In the evening she will have a seltzer. Exercise: 7 days a week of exercise inclusive of cardio and weights.  20 mins a day.  Eye exam: Completes annually contacts and glasses.  Last exam was done in 2022.  Dental exam: Completes semi-annually   Pap Smear: Completed in 01/25/2021 followed [facility].  History of HPV most recent Pap smear HPV negative with ASCUS Mammogram: Too young, currently breast-feeding.  Defer to GYN  Colonoscopy: Too young, currently average risk Lung Cancer Screening: N/A Dexa: Too young  Sleep: 1130 bedtime and get up 620am. Feels rested. Does not snore regularly     Review of Systems  Constitutional:  Negative for chills and fever.  Respiratory:  Negative for shortness of breath.   Cardiovascular:  Negative for chest pain.  Gastrointestinal:  Negative for abdominal pain, blood in stool, nausea and vomiting.       Bm daily   Genitourinary:  Negative for dysuria and hematuria.  Neurological:  Negative for tingling and headaches.  Psychiatric/Behavioral:  Negative for hallucinations and suicidal ideas.       Objective:     BP 96/60   Pulse 63   Temp (!) 96.8 F (36 C)   Resp 12   Ht 5\' 1"  (1.549 m)   Wt 112 lb 2 oz (50.9 kg)   SpO2 99%   BMI 21.19 kg/m    Physical Exam Vitals and nursing note reviewed.  Constitutional:      Appearance:  Normal appearance.  HENT:     Right Ear: Tympanic membrane, ear canal and external ear normal.     Left Ear: Tympanic membrane, ear canal and external ear normal.     Mouth/Throat:     Mouth: Mucous membranes are moist.     Pharynx: Oropharynx is clear.  Eyes:     Extraocular Movements: Extraocular movements intact.     Pupils: Pupils are equal, round, and reactive to light.     Comments: Wears contacts  Cardiovascular:     Rate and Rhythm: Normal rate and regular rhythm.     Pulses: Normal pulses.     Heart sounds: Normal heart sounds.  Pulmonary:     Effort: Pulmonary effort is normal.     Breath sounds: Normal breath sounds.  Abdominal:     General: Bowel sounds are normal. There is no distension.     Palpations: There is no mass.     Tenderness: There is no abdominal tenderness.     Hernia: No hernia is present.  Musculoskeletal:     Right lower leg: No edema.     Left lower leg: No edema.  Lymphadenopathy:     Cervical: No cervical adenopathy.  Skin:    General: Skin is warm.  Neurological:     General: No focal deficit present.  Mental Status: She is alert.     Deep Tendon Reflexes:     Reflex Scores:      Bicep reflexes are 2+ on the right side and 2+ on the left side.      Patellar reflexes are 2+ on the right side and 2+ on the left side.    Comments: Bilateral upper and lower extremity strength 5/5  Psychiatric:        Mood and Affect: Mood normal.        Behavior: Behavior normal.        Thought Content: Thought content normal.        Judgment: Judgment normal.      No results found for any visits on 11/05/21.    The ASCVD Risk score (Arnett DK, et al., 2019) failed to calculate for the following reasons:   The 2019 ASCVD risk score is only valid for ages 48 to 44    Assessment & Plan:   Problem List Items Addressed This Visit       Other   Preventative health care - Primary    Discussed age-appropriate immunizations and screening exams.   Patient was given booklet information at dismissal of chronic that has preventative healthcare maintenance for her age range inclusive of anticipatory guidance.  Patient had a form to be filled out for insurance purposes.  Pending labs we will fill form out and contact patient.      Relevant Orders   CBC   Comprehensive metabolic panel   Lipid panel   TSH    Return in about 1 year (around 11/06/2022) for CPE and labs.    Romilda Garret, NP

## 2021-11-10 ENCOUNTER — Other Ambulatory Visit (INDEPENDENT_AMBULATORY_CARE_PROVIDER_SITE_OTHER): Payer: Commercial Managed Care - PPO

## 2021-11-10 DIAGNOSIS — Z0000 Encounter for general adult medical examination without abnormal findings: Secondary | ICD-10-CM | POA: Diagnosis not present

## 2021-11-10 LAB — COMPREHENSIVE METABOLIC PANEL
ALT: 10 U/L (ref 0–35)
AST: 17 U/L (ref 0–37)
Albumin: 4.5 g/dL (ref 3.5–5.2)
Alkaline Phosphatase: 19 U/L — ABNORMAL LOW (ref 39–117)
BUN: 21 mg/dL (ref 6–23)
CO2: 29 mEq/L (ref 19–32)
Calcium: 9.2 mg/dL (ref 8.4–10.5)
Chloride: 102 mEq/L (ref 96–112)
Creatinine, Ser: 0.74 mg/dL (ref 0.40–1.20)
GFR: 101.59 mL/min (ref >60.00)
Glucose, Bld: 87 mg/dL (ref 70–99)
Potassium: 4.1 mEq/L (ref 3.5–5.1)
Sodium: 137 mEq/L (ref 135–145)
Total Bilirubin: 0.7 mg/dL (ref 0.2–1.2)
Total Protein: 6.9 g/dL (ref 6.0–8.3)

## 2021-11-10 LAB — CBC
HCT: 39.9 % (ref 36.0–46.0)
Hemoglobin: 13.4 g/dL (ref 12.0–15.0)
MCHC: 33.5 g/dL (ref 30.0–36.0)
MCV: 92.5 fl (ref 78.0–100.0)
Platelets: 208 10*3/uL (ref 150.0–400.0)
RBC: 4.31 Mil/uL (ref 3.87–5.11)
RDW: 12.9 % (ref 11.5–15.5)
WBC: 6.3 10*3/uL (ref 4.0–10.5)

## 2021-11-10 LAB — LIPID PANEL
Cholesterol: 147 mg/dL (ref 0–200)
HDL: 83.5 mg/dL (ref >39.00)
LDL Cholesterol: 55 mg/dL (ref 0–99)
NonHDL: 63.5
Total CHOL/HDL Ratio: 2
Triglycerides: 45 mg/dL (ref 0.0–149.0)
VLDL: 9 mg/dL (ref 0.0–40.0)

## 2021-11-10 LAB — TSH: TSH: 0.96 u[IU]/mL (ref 0.35–5.50)

## 2022-04-19 ENCOUNTER — Telehealth: Payer: Self-pay | Admitting: Nurse Practitioner

## 2022-04-19 NOTE — Telephone Encounter (Signed)
TOC ok by me

## 2022-04-19 NOTE — Telephone Encounter (Signed)
Patient called in and was wanting to transfer her care over to Dr. Ermalene Searing. She stated that she would rather have a female. Informed her that Dr. Ermalene Searing isn't accepting right now, but Mort Sawyers was. Patient wanted me to check and see if Dr. Ermalene Searing would before she scheduled with Mort Sawyers. Please advise. Thank you!

## 2022-04-19 NOTE — Telephone Encounter (Signed)
I will except her but in the next transfer care slot open, probably in June.  If she needs to be seen sooner she should just establish with.

## 2022-11-07 ENCOUNTER — Encounter: Payer: Commercial Managed Care - PPO | Admitting: Nurse Practitioner

## 2022-11-10 ENCOUNTER — Other Ambulatory Visit: Payer: Self-pay | Admitting: Family Medicine

## 2022-11-10 DIAGNOSIS — Z1231 Encounter for screening mammogram for malignant neoplasm of breast: Secondary | ICD-10-CM

## 2022-12-14 ENCOUNTER — Ambulatory Visit
Admission: RE | Admit: 2022-12-14 | Discharge: 2022-12-14 | Disposition: A | Discharge status: Home / Self Care | Payer: Commercial Managed Care - PPO | Source: Ambulatory Visit | Attending: Family Medicine | Admitting: Family Medicine

## 2022-12-14 DIAGNOSIS — Z1231 Encounter for screening mammogram for malignant neoplasm of breast: Secondary | ICD-10-CM
